# Patient Record
Sex: Female | Born: 1935 | Race: White | Hispanic: No | Marital: Single | Smoking: Former smoker
Health system: Southern US, Community
[De-identification: ages and names within clinical notes are randomized; demographics above are authoritative.]

## PROBLEM LIST (undated history)

## (undated) DIAGNOSIS — R531 Weakness: Secondary | ICD-10-CM

## (undated) DIAGNOSIS — D49519 Neoplasm of unspecified behavior of unspecified kidney: Secondary | ICD-10-CM

## (undated) DIAGNOSIS — M1990 Unspecified osteoarthritis, unspecified site: Secondary | ICD-10-CM

## (undated) DIAGNOSIS — N3281 Overactive bladder: Secondary | ICD-10-CM

## (undated) DIAGNOSIS — E785 Hyperlipidemia, unspecified: Secondary | ICD-10-CM

## (undated) DIAGNOSIS — N393 Stress incontinence (female) (male): Secondary | ICD-10-CM

## (undated) DIAGNOSIS — E039 Hypothyroidism, unspecified: Secondary | ICD-10-CM

## (undated) DIAGNOSIS — I10 Essential (primary) hypertension: Secondary | ICD-10-CM

## (undated) DIAGNOSIS — K08109 Complete loss of teeth, unspecified cause, unspecified class: Secondary | ICD-10-CM

## (undated) DIAGNOSIS — R319 Hematuria, unspecified: Secondary | ICD-10-CM

## (undated) DIAGNOSIS — R600 Localized edema: Secondary | ICD-10-CM

## (undated) DIAGNOSIS — I361 Nonrheumatic tricuspid (valve) insufficiency: Secondary | ICD-10-CM

## (undated) DIAGNOSIS — K219 Gastro-esophageal reflux disease without esophagitis: Secondary | ICD-10-CM

## (undated) DIAGNOSIS — K5909 Other constipation: Secondary | ICD-10-CM

## (undated) DIAGNOSIS — R5383 Other fatigue: Secondary | ICD-10-CM

## (undated) DIAGNOSIS — N183 Chronic kidney disease, stage 3 unspecified: Secondary | ICD-10-CM

## (undated) DIAGNOSIS — C679 Malignant neoplasm of bladder, unspecified: Secondary | ICD-10-CM

## (undated) DIAGNOSIS — Z8719 Personal history of other diseases of the digestive system: Secondary | ICD-10-CM

## (undated) DIAGNOSIS — Z972 Presence of dental prosthetic device (complete) (partial): Secondary | ICD-10-CM

## (undated) DIAGNOSIS — R5381 Other malaise: Secondary | ICD-10-CM

## (undated) HISTORY — PX: TRANSTHORACIC ECHOCARDIOGRAM: SHX275

---

## 1967-11-11 HISTORY — PX: VAGINAL HYSTERECTOMY: SUR661

## 1999-11-11 HISTORY — PX: CATARACT EXTRACTION W/ INTRAOCULAR LENS  IMPLANT, BILATERAL: SHX1307

## 2005-03-05 ENCOUNTER — Ambulatory Visit: Payer: Self-pay | Admitting: Unknown Physician Specialty

## 2006-03-02 ENCOUNTER — Ambulatory Visit: Payer: Self-pay | Admitting: Gastroenterology

## 2006-03-17 ENCOUNTER — Ambulatory Visit: Payer: Self-pay | Admitting: Unknown Physician Specialty

## 2006-03-24 ENCOUNTER — Ambulatory Visit: Payer: Self-pay | Admitting: Gastroenterology

## 2007-03-23 ENCOUNTER — Ambulatory Visit: Payer: Self-pay | Admitting: Unknown Physician Specialty

## 2008-02-08 ENCOUNTER — Ambulatory Visit: Payer: Self-pay | Admitting: Ophthalmology

## 2008-04-06 ENCOUNTER — Ambulatory Visit: Payer: Self-pay | Admitting: Unknown Physician Specialty

## 2009-05-01 ENCOUNTER — Ambulatory Visit: Payer: Self-pay | Admitting: Unknown Physician Specialty

## 2010-06-17 ENCOUNTER — Ambulatory Visit: Payer: Self-pay | Admitting: Unknown Physician Specialty

## 2010-10-30 ENCOUNTER — Ambulatory Visit: Payer: Self-pay | Admitting: Unknown Physician Specialty

## 2011-01-21 ENCOUNTER — Emergency Department: Payer: Self-pay | Admitting: Unknown Physician Specialty

## 2011-07-22 ENCOUNTER — Ambulatory Visit: Payer: Self-pay | Admitting: Unknown Physician Specialty

## 2012-07-22 ENCOUNTER — Ambulatory Visit: Payer: Self-pay | Admitting: Unknown Physician Specialty

## 2013-03-17 ENCOUNTER — Emergency Department: Payer: Self-pay | Admitting: Emergency Medicine

## 2013-03-17 LAB — URINALYSIS, COMPLETE
Bacteria: NONE SEEN
Bilirubin,UR: NEGATIVE
Glucose,UR: NEGATIVE mg/dL (ref 0–75)
Ketone: NEGATIVE
Nitrite: NEGATIVE
Ph: 5 (ref 4.5–8.0)
Protein: NEGATIVE

## 2013-03-17 LAB — CBC
HCT: 38.3 % (ref 35.0–47.0)
HGB: 13.3 g/dL (ref 12.0–16.0)
MCH: 30 pg (ref 26.0–34.0)
MCHC: 34.7 g/dL (ref 32.0–36.0)
MCV: 86 fL (ref 80–100)
RBC: 4.44 10*6/uL (ref 3.80–5.20)

## 2013-03-17 LAB — CK TOTAL AND CKMB (NOT AT ARMC)
CK, Total: 119 U/L (ref 21–215)
CK-MB: 1.7 ng/mL (ref 0.5–3.6)

## 2013-03-17 LAB — COMPREHENSIVE METABOLIC PANEL
Albumin: 4.1 g/dL (ref 3.4–5.0)
Alkaline Phosphatase: 77 U/L (ref 50–136)
Anion Gap: 8 (ref 7–16)
Bilirubin,Total: 0.5 mg/dL (ref 0.2–1.0)
Chloride: 103 mmol/L (ref 98–107)
Co2: 26 mmol/L (ref 21–32)
Creatinine: 1.45 mg/dL — ABNORMAL HIGH (ref 0.60–1.30)
EGFR (Non-African Amer.): 31 — ABNORMAL LOW
Glucose: 109 mg/dL — ABNORMAL HIGH (ref 65–99)
Osmolality: 280 (ref 275–301)
SGOT(AST): 26 U/L (ref 15–37)
SGPT (ALT): 13 U/L (ref 12–78)
Sodium: 137 mmol/L (ref 136–145)

## 2014-01-06 ENCOUNTER — Ambulatory Visit: Payer: Self-pay | Admitting: Internal Medicine

## 2015-04-03 ENCOUNTER — Emergency Department
Admission: EM | Admit: 2015-04-03 | Discharge: 2015-04-03 | Disposition: A | Discharge status: Home / Self Care | Payer: Medicare Other | Attending: Emergency Medicine | Admitting: Emergency Medicine

## 2015-04-03 ENCOUNTER — Encounter: Payer: Self-pay | Admitting: Emergency Medicine

## 2015-04-03 DIAGNOSIS — R319 Hematuria, unspecified: Secondary | ICD-10-CM | POA: Diagnosis present

## 2015-04-03 DIAGNOSIS — I10 Essential (primary) hypertension: Secondary | ICD-10-CM | POA: Insufficient documentation

## 2015-04-03 DIAGNOSIS — N3001 Acute cystitis with hematuria: Secondary | ICD-10-CM | POA: Insufficient documentation

## 2015-04-03 HISTORY — DX: Essential (primary) hypertension: I10

## 2015-04-03 LAB — COMPREHENSIVE METABOLIC PANEL
ALT: 9 U/L — ABNORMAL LOW (ref 14–54)
AST: 19 U/L (ref 15–41)
Albumin: 4.3 g/dL (ref 3.5–5.0)
Alkaline Phosphatase: 63 U/L (ref 38–126)
Anion gap: 6 (ref 5–15)
BUN: 24 mg/dL — AB (ref 6–20)
CALCIUM: 8.9 mg/dL (ref 8.9–10.3)
CHLORIDE: 107 mmol/L (ref 101–111)
CO2: 27 mmol/L (ref 22–32)
Creatinine, Ser: 1.6 mg/dL — ABNORMAL HIGH (ref 0.44–1.00)
GFR calc Af Amer: 31 mL/min — ABNORMAL LOW (ref >60)
GFR calc non Af Amer: 27 mL/min — ABNORMAL LOW (ref >60)
GLUCOSE: 109 mg/dL — AB (ref 65–99)
Potassium: 4.3 mmol/L (ref 3.5–5.1)
SODIUM: 140 mmol/L (ref 135–145)
Total Bilirubin: 0.3 mg/dL (ref 0.3–1.2)
Total Protein: 7.4 g/dL (ref 6.5–8.1)

## 2015-04-03 LAB — URINALYSIS COMPLETE WITH MICROSCOPIC (ARMC ONLY)
BILIRUBIN URINE: NEGATIVE
Glucose, UA: NEGATIVE mg/dL
Ketones, ur: NEGATIVE mg/dL
Nitrite: NEGATIVE
PH: 5 (ref 5.0–8.0)
PROTEIN: 100 mg/dL — AB
Specific Gravity, Urine: 1.02 (ref 1.005–1.030)
Trans Epithel, UA: 1

## 2015-04-03 LAB — CBC
HCT: 38.7 % (ref 35.0–47.0)
Hemoglobin: 12.7 g/dL (ref 12.0–16.0)
MCH: 27.8 pg (ref 26.0–34.0)
MCHC: 32.7 g/dL (ref 32.0–36.0)
MCV: 84.9 fL (ref 80.0–100.0)
Platelets: 177 10*3/uL (ref 150–440)
RBC: 4.56 MIL/uL (ref 3.80–5.20)
RDW: 15.7 % — AB (ref 11.5–14.5)
WBC: 4.4 10*3/uL (ref 3.6–11.0)

## 2015-04-03 MED ORDER — CIPROFLOXACIN HCL 250 MG PO TABS: 250.0000 mg | ORAL_TABLET | Freq: Two times a day (BID) | ORAL | Status: AC

## 2015-04-03 MED ORDER — CIPROFLOXACIN HCL 500 MG PO TABS
ORAL_TABLET | ORAL | Status: AC
  Administered 2015-04-03: 500 mg via ORAL
  Filled 2015-04-03: qty 1

## 2015-04-03 MED ORDER — CIPROFLOXACIN HCL 500 MG PO TABS
500.0000 mg | ORAL_TABLET | Freq: Once | ORAL | Status: AC
  Administered 2015-04-03: 500 mg via ORAL

## 2015-04-03 NOTE — Discharge Instructions (Signed)

## 2015-04-03 NOTE — ED Provider Notes (Signed)
[facility] Provider Note  ____________________________________________  Time seen: 6:35 PM  I have reviewed the triage vital signs and the nursing notes. Elements of past medical history surgical history allergies medications and social history and family history obtained from [facility] everywhere through primary care doctor's clinic visit note in April 2016  HISTORY  Chief Complaint Hematuria    HPI Jody Mcclain is a 81 y.o. female who complains of hematuria since yesterday. She denies any dysuria frequency or urgency although she chronically has together the bathroom frequently due to her HCTZ use for hypertension. No fever chills nausea vomiting lightheadedness or syncope. No chest pain or shortness of breath. She is tolerating oral medications and eating and drinking normally. This apart from the hematuria she feels totally fine. She does note that she has had several urinary tract infections in the past, which may be related to bladder prolapse     Past Medical History  Diagnosis Date  . Hypertension   . Thyroid disease   . Hiatal hernia unk    There are no active problems to display for this patient.   History reviewed. No pertinent past surgical history.  Current Outpatient Rx  Name  Route  Sig  Dispense  Refill  . ciprofloxacin (CIPRO) 250 MG tablet   Oral   Take 1 tablet (250 mg total) by mouth 2 (two) times daily.   20 tablet   0   amLODIPine (NORVASC) 10 MG tablet, TAKE 1 TABLET BY MOUTH ONCE A DAY aspirin 81 MG EC tablet, Take 81 mg by mouth once daily. calcium carbonate-vit D3-min (CALCIUM 600 + MINERALS) 600 mg calcium- 200 unit Tab, Take 1 tablet by mouth once daily. cholecalciferol (VITAMIN D3) 1,000 unit capsule, Take 1,000 Units by mouth once daily. hydrochlorothiazide (HYDRODIURIL) 25 MG tablet, Take 1 tablet (25 mg total) by mouth once daily. levothyroxine (SYNTHROID, LEVOTHROID) 50 MCG tablet, TAKE 1  TABLET BY MOUTH ONCE A DAY lovastatin (MEVACOR) 40 MG tablet, TAKE 1 TABLET BY MOUTH AT BEDTIME metoprolol tartrate (LOPRESSOR) 25 MG tablet, 25 mg. Take 1/2 tablets by mouth once a day. omeprazole (PRILOSEC) 40 MG DR capsule, Take 1 capsule (40 mg total) by mouth once daily. ZETIA 10 mg tablet, TAKE 1 TABLET BY MOUTH ONCE A DAY AS DIRECTED   Allergies Review of patient's allergies indicates not on file.  No family history on file.  Social History History  Substance Use Topics  . Smoking status: Never Smoker   . Smokeless tobacco: Not on file  . Alcohol Use: No    Allergies as of 02/21/2015 Elta [place] as Reviewed 02/21/2015  Allergen Reaction Noted  . Augmentin [amoxicillin-pot clavulanate] Nausea 05/23/2014  . Boniva [ibandronate] Unknown 05/23/2014  . Ceftin [cefuroxime axetil] Nausea 05/23/2014  . Fosamax [alendronate] Unknown 05/23/2014  . Lipitor [atorvastatin] Unknown 05/23/2014  . Sulfa (sulfonamide antibiotics) Nausea 05/23/2014  . Thiazides Unknown 05/23/2014   Past Medical History  Diagnosis Date  . Hypothyroid  . Hiatal hernia  . Hypercholesterolemia  . Hypertension  . COPD (chronic obstructive pulmonary disease)  on chest x-ray  . Osteoporosis  . DJD (degenerative joint disease)  . DDD (degenerative disc disease)  . PVC's (premature ventricular contractions)  hx of; used low dose metoprolol but then had bradycardia and fatigue so discontinued  . Severe tricuspid regurgitation  . Sinus arrhythmia  . GERD (gastroesophageal reflux disease)   Past Surgical History  Procedure Laterality Date  . Hysterectomy  . Cataract extraction  Left  . Cataract extraction Right  . Two vertebral fractures secondary to a fall 2003   History   Social History  . Marital Status: Married  Spouse Name: N/A  . Number of Children: 5  . Years of Education: 7   Social History Main Topics  . Smoking status: Never Smoker  . Smokeless tobacco: Never Used  . Alcohol Use: No  .  Drug Use: No  . Sexual Activity: Defer   Other Topics Concern  . None   Social History Narrative   Family History  Problem Relation Age of Onset  . Hypertension Other  . Heart disease Other  . Stroke Other  . Thyroid disease Other  . Psoriasis Other  . Heart attack Mother  . No Known Problems Father    Review of Systems  Constitutional: No fever or chills. No weight changes Eyes:No blurry vision or double vision.  ENT: No sore throat. Cardiovascular: No chest pain. Respiratory: No dyspnea or cough. Gastrointestinal: Negative for abdominal pain, vomiting and diarrhea.  No BRBPR or melena. Genitourinary: Negative for dysuria, urinary retention, bloody urine, or difficulty urinating. Musculoskeletal: Negative for back pain. No joint swelling or pain. Skin: Negative for rash. Neurological: Negative for headaches, focal weakness or numbness. Psychiatric:No anxiety or depression.   Endocrine:No hot/cold intolerance, changes in energy, or sleep difficulty.  10-point ROS otherwise negative.  ____________________________________________   PHYSICAL EXAM:  VITAL SIGNS: ED Triage Vitals  Enc Vitals Group     BP 04/03/15 1817 180/69 mmHg     Pulse Rate 04/03/15 1817 71     Resp 04/03/15 1817 20     Temp 04/03/15 1817 98.2 F (36.8 C)     Temp Source 04/03/15 1817 Oral     SpO2 04/03/15 1817 99 %     Weight 04/03/15 1817 120 lb (54.432 kg)     Height 04/03/15 1817 5' (1.524 m)     Head Cir --      Peak Flow --      Pain Score 04/03/15 1818 2     Pain Loc --      Pain Edu? --      Excl. in [place]? --      Constitutional: Alert and oriented. Well appearing and in no distress. Eyes: No scleral icterus. No conjunctival pallor. PERRL. EOMI ENT   Head: Normocephalic and atraumatic.   Nose: No congestion/rhinnorhea. No septal hematoma   Mouth/Throat: MMM, no pharyngeal erythema. No peritonsillar mass. No uvula shift.   Neck: No stridor. No SubQ emphysema. No  meningismus. Hematological/Lymphatic/Immunilogical: No cervical lymphadenopathy. Cardiovascular: RRR. Normal and symmetric distal pulses are present in all extremities. No murmurs, rubs, or gallops. Respiratory: Normal respiratory effort without tachypnea nor retractions. Breath sounds are clear and equal bilaterally. No wheezes/rales/rhonchi. Gastrointestinal: Soft and nontender. No distention. There is no CVA tenderness.  No rebound, rigidity, or guarding. Genitourinary: deferred Musculoskeletal: Nontender with normal range of motion in all extremities. No joint effusions.  No lower extremity tenderness.  No edema. Neurologic:   Normal speech and language.  CN 2-10 normal. Motor grossly intact. No pronator drift.  Normal gait. No gross focal neurologic deficits are appreciated.  Skin:  Skin is warm, dry and intact. No rash noted.  No petechiae, purpura, or bullae. Psychiatric: Mood and affect are normal. Speech and behavior are normal. Patient exhibits appropriate insight and judgment.  ____________________________________________    LABS (pertinent positives/negatives) (all labs ordered are listed, but only abnormal results are displayed) Labs Reviewed  CBC - Abnormal; Notable for the following:    RDW 15.7 (*)    All other components within normal limits  COMPREHENSIVE METABOLIC PANEL - Abnormal; Notable for the following:    Glucose, Bld 109 (*)    BUN 24 (*)    Creatinine, Ser 1.60 (*)    ALT 9 (*)    GFR calc non Af Amer 27 (*)    GFR calc Af Amer 31 (*)    All other components within normal limits  URINALYSIS COMPLETEWITH MICROSCOPIC ([facility])  - Abnormal; Notable for the following:    Color, Urine YELLOW (*)    APPearance CLOUDY (*)    Hgb urine dipstick 3+ (*)    Protein, ur 100 (*)    Leukocytes, UA 1+ (*)    Bacteria, UA RARE (*)    Squamous Epithelial / LPF 6-30 (*)    All other components within normal limits  URINE CULTURE   ___Urine red blood cell too  numerous to count, urine white blood cell too numerous to count _________________________________________   EKG    ____________________________________________    RADIOLOGY    ____________________________________________   PROCEDURES  ____________________________________________   INITIAL IMPRESSION / ASSESSMENT AND PLAN / ED COURSE  Pertinent labs & imaging results that were available during my care of the patient were reviewed by me and considered in my medical decision making (see chart for details).  Patient presents with hematuria and bacteriuria consistent with urinary tract infection. There is no evidence of pyelonephritis or sepsis. No evidence of kidney stone, kidney abscess, or obstructed ureteral abscess. Patient is well-appearing, no acute distress, at baseline other than the UTI. Due to her allergies I will start her on Cipro 500 by mouth now and 250 twice a day until she follows up with her primary care doctor. I did counsel her on repeated UA with her PCP to ensure resolution and possibility of renal cancer if she has persistent hematuria.  ____________________________________________   FINAL CLINICAL IMPRESSION(S) / ED DIAGNOSES  Final diagnoses:  Acute cystitis with hematuria      Carrie Mew, MD 04/03/15 [phone]

## 2015-04-03 NOTE — ED Notes (Signed)
Noticed blood in urine yesterday and again today

## 2015-04-06 LAB — URINE CULTURE: CULTURE: NO GROWTH

## 2015-04-20 ENCOUNTER — Other Ambulatory Visit: Payer: Self-pay | Admitting: Internal Medicine

## 2015-04-20 DIAGNOSIS — N184 Chronic kidney disease, stage 4 (severe): Secondary | ICD-10-CM

## 2015-04-26 ENCOUNTER — Ambulatory Visit
Admission: RE | Admit: 2015-04-26 | Discharge: 2015-04-26 | Disposition: A | Discharge status: Home / Self Care | Payer: Medicare Other | Source: Ambulatory Visit | Attending: Internal Medicine | Admitting: Internal Medicine

## 2015-04-26 DIAGNOSIS — N281 Cyst of kidney, acquired: Secondary | ICD-10-CM | POA: Insufficient documentation

## 2015-04-26 DIAGNOSIS — N184 Chronic kidney disease, stage 4 (severe): Secondary | ICD-10-CM | POA: Diagnosis not present

## 2015-05-01 ENCOUNTER — Ambulatory Visit: Payer: Medicare Other

## 2015-05-08 ENCOUNTER — Other Ambulatory Visit: Payer: Self-pay | Admitting: Urology

## 2015-05-15 ENCOUNTER — Encounter (HOSPITAL_BASED_OUTPATIENT_CLINIC_OR_DEPARTMENT_OTHER): Payer: Self-pay | Admitting: *Deleted

## 2015-05-16 ENCOUNTER — Encounter (HOSPITAL_BASED_OUTPATIENT_CLINIC_OR_DEPARTMENT_OTHER): Payer: Self-pay | Admitting: *Deleted

## 2015-05-16 NOTE — Progress Notes (Signed)
NPO AFTER MN.  ARRIVE AT [id number].  NEEDS ISTAT 8.  CURRENT EKG,  LOV NOTE, ECHO TO BE FAXED FROM DR PARASCHOS Jefm Bryant).  WILL TAKE AM MEDS W/ SIPS OF WATER DOS.

## 2015-05-17 ENCOUNTER — Other Ambulatory Visit (HOSPITAL_COMMUNITY): Payer: Self-pay | Admitting: Urology

## 2015-05-17 DIAGNOSIS — D49519 Neoplasm of unspecified behavior of unspecified kidney: Secondary | ICD-10-CM

## 2015-05-17 NOTE — Progress Notes (Signed)
Received echo and ekg from Dr. Alean Rinne office.  Needs ekg on arrival.

## 2015-05-17 NOTE — Progress Notes (Signed)
Chart reviewed by Dr. Delma Post, ok to proceed.

## 2015-05-18 ENCOUNTER — Encounter (HOSPITAL_BASED_OUTPATIENT_CLINIC_OR_DEPARTMENT_OTHER)
Admission: RE | Disposition: A | Discharge status: Home / Self Care | Payer: Self-pay | Source: Ambulatory Visit | Attending: Urology

## 2015-05-18 ENCOUNTER — Other Ambulatory Visit: Payer: Self-pay

## 2015-05-18 ENCOUNTER — Encounter (HOSPITAL_BASED_OUTPATIENT_CLINIC_OR_DEPARTMENT_OTHER): Payer: Self-pay | Admitting: Anesthesiology

## 2015-05-18 ENCOUNTER — Ambulatory Visit (HOSPITAL_BASED_OUTPATIENT_CLINIC_OR_DEPARTMENT_OTHER): Payer: Medicare Other | Admitting: Anesthesiology

## 2015-05-18 ENCOUNTER — Ambulatory Visit (HOSPITAL_BASED_OUTPATIENT_CLINIC_OR_DEPARTMENT_OTHER)
Admission: RE | Admit: 2015-05-18 | Discharge: 2015-05-18 | Disposition: A | Discharge status: Home / Self Care | Payer: Medicare Other | Source: Ambulatory Visit | Attending: Urology | Admitting: Urology

## 2015-05-18 DIAGNOSIS — E780 Pure hypercholesterolemia: Secondary | ICD-10-CM | POA: Insufficient documentation

## 2015-05-18 DIAGNOSIS — C679 Malignant neoplasm of bladder, unspecified: Secondary | ICD-10-CM | POA: Diagnosis not present

## 2015-05-18 DIAGNOSIS — Z90710 Acquired absence of both cervix and uterus: Secondary | ICD-10-CM | POA: Insufficient documentation

## 2015-05-18 DIAGNOSIS — Z882 Allergy status to sulfonamides status: Secondary | ICD-10-CM | POA: Insufficient documentation

## 2015-05-18 DIAGNOSIS — N329 Bladder disorder, unspecified: Secondary | ICD-10-CM | POA: Diagnosis present

## 2015-05-18 DIAGNOSIS — N183 Chronic kidney disease, stage 3 (moderate): Secondary | ICD-10-CM | POA: Insufficient documentation

## 2015-05-18 DIAGNOSIS — Z87891 Personal history of nicotine dependence: Secondary | ICD-10-CM | POA: Diagnosis not present

## 2015-05-18 DIAGNOSIS — Z888 Allergy status to other drugs, medicaments and biological substances status: Secondary | ICD-10-CM | POA: Insufficient documentation

## 2015-05-18 DIAGNOSIS — Z79899 Other long term (current) drug therapy: Secondary | ICD-10-CM | POA: Diagnosis not present

## 2015-05-18 DIAGNOSIS — I129 Hypertensive chronic kidney disease with stage 1 through stage 4 chronic kidney disease, or unspecified chronic kidney disease: Secondary | ICD-10-CM | POA: Diagnosis not present

## 2015-05-18 DIAGNOSIS — K449 Diaphragmatic hernia without obstruction or gangrene: Secondary | ICD-10-CM | POA: Diagnosis not present

## 2015-05-18 DIAGNOSIS — K219 Gastro-esophageal reflux disease without esophagitis: Secondary | ICD-10-CM | POA: Diagnosis not present

## 2015-05-18 DIAGNOSIS — D494 Neoplasm of unspecified behavior of bladder: Secondary | ICD-10-CM

## 2015-05-18 HISTORY — DX: Chronic kidney disease, stage 3 unspecified: N18.30

## 2015-05-18 HISTORY — DX: Nonrheumatic tricuspid (valve) insufficiency: I36.1

## 2015-05-18 HISTORY — DX: Presence of dental prosthetic device (complete) (partial): Z97.2

## 2015-05-18 HISTORY — DX: Other fatigue: R53.83

## 2015-05-18 HISTORY — DX: Stress incontinence (female) (male): N39.3

## 2015-05-18 HISTORY — PX: TRANSURETHRAL RESECTION OF BLADDER TUMOR WITH GYRUS (TURBT-GYRUS): SHX6458

## 2015-05-18 HISTORY — DX: Presence of dental prosthetic device (complete) (partial): K08.109

## 2015-05-18 HISTORY — DX: Unspecified osteoarthritis, unspecified site: M19.90

## 2015-05-18 HISTORY — DX: Hyperlipidemia, unspecified: E78.5

## 2015-05-18 HISTORY — DX: Hematuria, unspecified: R31.9

## 2015-05-18 HISTORY — DX: Overactive bladder: N32.81

## 2015-05-18 HISTORY — DX: Gastro-esophageal reflux disease without esophagitis: K21.9

## 2015-05-18 HISTORY — DX: Personal history of other diseases of the digestive system: Z87.19

## 2015-05-18 HISTORY — DX: Chronic kidney disease, stage 3 (moderate): N18.3

## 2015-05-18 HISTORY — DX: Localized edema: R60.0

## 2015-05-18 HISTORY — DX: Other constipation: K59.09

## 2015-05-18 HISTORY — DX: Hypothyroidism, unspecified: E03.9

## 2015-05-18 HISTORY — DX: Other malaise: R53.81

## 2015-05-18 HISTORY — DX: Weakness: R53.1

## 2015-05-18 LAB — POCT I-STAT, CHEM 8
BUN: 20 mg/dL (ref 6–20)
Calcium, Ion: 1.24 mmol/L (ref 1.13–1.30)
Chloride: 107 mmol/L (ref 101–111)
Creatinine, Ser: 1.4 mg/dL — ABNORMAL HIGH (ref 0.44–1.00)
Glucose, Bld: 93 mg/dL (ref 65–99)
HCT: 39 % (ref 36.0–46.0)
Hemoglobin: 13.3 g/dL (ref 12.0–15.0)
Potassium: 4.4 mmol/L (ref 3.5–5.1)
Sodium: 142 mmol/L (ref 135–145)
TCO2: 24 mmol/L (ref 0–100)

## 2015-05-18 SURGERY — TRANSURETHRAL RESECTION OF BLADDER TUMOR WITH GYRUS (TURBT-GYRUS)
Anesthesia: General

## 2015-05-18 MED ORDER — HYDROCODONE-ACETAMINOPHEN 5-325 MG PO TABS: 1.0000 | ORAL_TABLET | Freq: Four times a day (QID) | ORAL | Status: DC | PRN

## 2015-05-18 MED ORDER — DEXAMETHASONE SODIUM PHOSPHATE 4 MG/ML IJ SOLN
INTRAMUSCULAR | Status: DC | PRN
  Administered 2015-05-18: 10 mg via INTRAVENOUS

## 2015-05-18 MED ORDER — PROPOFOL 10 MG/ML IV BOLUS
INTRAVENOUS | Status: DC | PRN
  Administered 2015-05-18: 30 mg via INTRAVENOUS
  Administered 2015-05-18: 100 mg via INTRAVENOUS

## 2015-05-18 MED ORDER — PHENAZOPYRIDINE HCL 200 MG PO TABS: 200.0000 mg | ORAL_TABLET | Freq: Three times a day (TID) | ORAL | Status: DC | PRN

## 2015-05-18 MED ORDER — PHENAZOPYRIDINE HCL 200 MG PO TABS
200.0000 mg | ORAL_TABLET | Freq: Three times a day (TID) | ORAL | Status: AC
  Administered 2015-05-18: 200 mg via ORAL
  Filled 2015-05-18: qty 1

## 2015-05-18 MED ORDER — CIPROFLOXACIN IN D5W 200 MG/100ML IV SOLN
200.0000 mg | INTRAVENOUS | Status: AC
  Administered 2015-05-18: 200 mg via INTRAVENOUS
  Filled 2015-05-18: qty 100

## 2015-05-18 MED ORDER — PHENAZOPYRIDINE HCL 100 MG PO TABS
ORAL_TABLET | ORAL | Status: AC
  Filled 2015-05-18: qty 2

## 2015-05-18 MED ORDER — ACETAMINOPHEN 10 MG/ML IV SOLN
INTRAVENOUS | Status: DC | PRN
  Administered 2015-05-18: 1000 mg via INTRAVENOUS

## 2015-05-18 MED ORDER — SODIUM CHLORIDE 0.9 % IR SOLN
Status: DC | PRN
  Administered 2015-05-18: 12000 mL via INTRAVESICAL

## 2015-05-18 MED ORDER — CIPROFLOXACIN IN D5W 200 MG/100ML IV SOLN
INTRAVENOUS | Status: AC
  Filled 2015-05-18: qty 100

## 2015-05-18 MED ORDER — PHENYLEPHRINE HCL 10 MG/ML IJ SOLN
INTRAMUSCULAR | Status: DC | PRN
  Administered 2015-05-18: 120 ug via INTRAVENOUS
  Administered 2015-05-18: 80 ug via INTRAVENOUS
  Administered 2015-05-18: 40 ug via INTRAVENOUS

## 2015-05-18 MED ORDER — SODIUM CHLORIDE 0.9 % IV SOLN
INTRAVENOUS | Status: DC
Start: 2015-05-18 — End: 2015-05-18
  Administered 2015-05-18: 09:00:00 via INTRAVENOUS
  Filled 2015-05-18: qty 1000

## 2015-05-18 MED ORDER — ONDANSETRON HCL 4 MG/2ML IJ SOLN
INTRAMUSCULAR | Status: DC | PRN
  Administered 2015-05-18: 4 mg via INTRAVENOUS

## 2015-05-18 MED ORDER — FENTANYL CITRATE (PF) 100 MCG/2ML IJ SOLN
INTRAMUSCULAR | Status: AC
  Filled 2015-05-18: qty 2

## 2015-05-18 MED ORDER — FENTANYL CITRATE (PF) 100 MCG/2ML IJ SOLN
INTRAMUSCULAR | Status: DC | PRN
  Administered 2015-05-18: 50 ug via INTRAVENOUS

## 2015-05-18 MED ORDER — LIDOCAINE HCL (CARDIAC) 20 MG/ML IV SOLN
INTRAVENOUS | Status: DC | PRN
  Administered 2015-05-18: 40 mg via INTRAVENOUS

## 2015-05-18 SURGICAL SUPPLY — 36 items
BAG DRAIN URO-CYSTO SKYTR STRL (DRAIN) ×2 IMPLANT
BAG DRN ANRFLXCHMBR STRAP LEK (BAG)
BAG DRN UROCATH (DRAIN) ×1
BAG URINE DRAINAGE (UROLOGICAL SUPPLIES) IMPLANT
BAG URINE LEG 19OZ MD ST LTX (BAG) IMPLANT
CANISTER SUCT LVC 12 LTR MEDI- (MISCELLANEOUS) ×2 IMPLANT
CATH FOLEY 3WAY 30CC 24FR (CATHETERS)
CATH HEMA 3WAY 30CC 24FR COUDE (CATHETERS) IMPLANT
CATH HEMA 3WAY 30CC 24FR RND (CATHETERS) IMPLANT
CATH URTH STD 24FR FL 3W 2 (CATHETERS) ×1 IMPLANT
CLOTH BEACON ORANGE TIMEOUT ST (SAFETY) ×2 IMPLANT
ELECT BIVAP BIPO 22/24 DONUT (ELECTROSURGICAL) ×2
ELECT BUTTON BIOP 24F 90D PLAS (MISCELLANEOUS) IMPLANT
ELECT REM PT RETURN 9FT ADLT (ELECTROSURGICAL) ×2
ELECTRD BIVAP BIPO 22/24 DONUT (ELECTROSURGICAL) ×1 IMPLANT
ELECTRODE REM PT RTRN 9FT ADLT (ELECTROSURGICAL) ×1 IMPLANT
EVACUATOR MICROVAS BLADDER (UROLOGICAL SUPPLIES) ×1 IMPLANT
GLOVE BIO SURGEON STRL SZ8 (GLOVE) ×2 IMPLANT
GLOVE BIOGEL M 7.0 STRL (GLOVE) ×1 IMPLANT
GLOVE INDICATOR 7.0 STRL GRN (GLOVE) ×1 IMPLANT
GLOVE SURG SS PI 7.0 STRL IVOR (GLOVE) ×2 IMPLANT
GOWN STRL REUS W/ TWL LRG LVL3 (GOWN DISPOSABLE) ×1 IMPLANT
GOWN STRL REUS W/ TWL XL LVL3 (GOWN DISPOSABLE) ×1 IMPLANT
GOWN STRL REUS W/TWL LRG LVL3 (GOWN DISPOSABLE) ×4
GOWN STRL REUS W/TWL XL LVL3 (GOWN DISPOSABLE) ×2
HOLDER FOLEY CATH W/STRAP (MISCELLANEOUS) IMPLANT
IV NS IRRIG 3000ML ARTHROMATIC (IV SOLUTION) ×4 IMPLANT
LOOP CUT BIPOLAR 24F LRG (ELECTROSURGICAL) ×2 IMPLANT
MANIFOLD NEPTUNE II (INSTRUMENTS) ×1 IMPLANT
NS IRRIG 500ML POUR BTL (IV SOLUTION) ×2 IMPLANT
PACK CYSTO (CUSTOM PROCEDURE TRAY) ×2 IMPLANT
PLUG CATH AND CAP STER (CATHETERS) IMPLANT
SET ASPIRATION TUBING (TUBING) ×2 IMPLANT
SYR 30ML LL (SYRINGE) IMPLANT
SYRINGE IRR TOOMEY STRL 70CC (SYRINGE) IMPLANT
WATER STERILE IRR 3000ML UROMA (IV SOLUTION) IMPLANT

## 2015-05-18 NOTE — Anesthesia Procedure Notes (Addendum)
Procedure Name: Intubation Date/Time: 05/18/2015 9:29 AM Performed by: Wanita Chamberlain Pre-anesthesia Checklist: Timeout performed, Patient identified, Emergency Drugs available, Suction available and Patient being monitored Patient Re-evaluated:Patient Re-evaluated prior to inductionOxygen Delivery Method: Circle system utilized Preoxygenation: Pre-oxygenation with 100% oxygen Intubation Type: IV induction, Cricoid Pressure applied and Rapid sequence Grade View: Grade I Tube type: Oral Tube size: 7.0 mm Number of attempts: 1 Airway Equipment and Method: Stylet Placement Confirmation: positive ETCO2,  ETT inserted through vocal cords under direct vision and breath sounds checked- equal and bilateral Secured at: 19 cm Tube secured with: Tape Dental Injury: Teeth and Oropharynx as per pre-operative assessment  Comments: Elective intubation. Pt with significant reflux Hx pt didn't take her protonix this am

## 2015-05-18 NOTE — H&P (Signed)
Reason For Visit Jody Mcclain is a 81 year old female patient of Dr. Candiss Norse seen in office consultation today for further evaluation of gross hematuria and a possible bladder lesion.   History of Present Illness The patient has had an elevation of her creatinine to 1.5. This prompted a renal ultrasound on 04/26/15 which revealed changes of the kidneys consistent with medical renal disease, bilateral renal cysts as well as a 1.6 cm echogenic focus within the bladder that did not appear to be mobile like a clot. She had been seen in the emergency room and, as is typical, was treated with antibiotics for UTI which she did not have and this was proven by a negative culture. She has been a cigarette smoker (60-pack-year history). She reports to me that for 2-3 weeks she has experienced gross hematuria. It has been associated with clots. She said for the past year now she has had nocturia 5-6 times. This is associated with frequency and urgency in the daytime. She also indicates that she had some low back pain but has been present for some time. She said she was told in the past that she might have a kidney stone but has never passed a stone or had any symptoms to suggest passage of the stone. She does however report that she has discomfort in her bladder when it fills up and gets relief when she empties her bladder.  Her gross hematuria and bladder mass would be considered moderately severe with no modifying factors for other associated signs and symptoms than as above.   Past Medical History Problems  1. History of High cholesterol (E78.0) 2. History of arthritis (Z87.39) 3. History of esophageal reflux (Z87.19) 4. History of hypertension (Z86.79)  Surgical History Problems  1. History of Hysterectomy  Current Meds 1. AmLODIPine Besylate 10 MG Oral Tablet;  Therapy: (Recorded:28Jun2016) to Recorded 2. Aspirin Low Dose 81 MG TABS;  Therapy: (Recorded:28Jun2016) to Recorded 3. Hydrochlorothiazide 25  MG Oral Tablet;  Therapy: (Recorded:28Jun2016) to Recorded 4. Levothyroxine Sodium 50 MCG Oral Tablet;  Therapy: (Recorded:28Jun2016) to Recorded 5. Lovastatin 40 MG Oral Tablet;  Therapy: (Recorded:28Jun2016) to Recorded 6. Metoprolol Tartrate 25 MG Oral Tablet;  Therapy: (Recorded:28Jun2016) to Recorded 7. Omeprazole 40 MG Oral Capsule Delayed Release;  Therapy: (Recorded:28Jun2016) to Recorded 8. Zetia 10 MG Oral Tablet;  Therapy: (Recorded:28Jun2016) to Recorded  Allergies Medication  1. No Known Drug Allergies  Family History Problems  1. Family history of No significant past medical history : Mother  Social History Problems    Former smoker [phone])   [id number] years ago   Number of children   2 sons and 3 daughters   Retired   Widower  Review of Systems Genitourinary, constitutional, skin, eye, otolaryngeal, hematologic/lymphatic, cardiovascular, pulmonary, endocrine, musculoskeletal, gastrointestinal, neurological and psychiatric system(s) were reviewed and pertinent findings if present are noted and are otherwise negative.  Genitourinary: urinary frequency, nocturia and hematuria.  Gastrointestinal: heartburn and constipation.  Constitutional: feeling tired (fatigue).  Eyes: blurred vision.  ENT: sinus problems.  Cardiovascular: leg swelling.  Respiratory: shortness of breath.  Musculoskeletal: back pain.    Vitals Vital Signs   Height: 5 ft 1 in Weight: 124 lb  BMI Calculated: 23.43 BSA Calculated: 1.54 Blood Pressure: 158 / 66 Heart Rate: 75  Physical Exam Constitutional: Well nourished and well developed . No acute distress.  ENT:. The ears and nose are normal in appearance.  Neck: The appearance of the neck is normal and no neck mass is present.  Pulmonary:  No respiratory distress and normal respiratory rhythm and effort.  Cardiovascular: Heart rate and rhythm are normal . No peripheral edema.  Abdomen: The abdomen is soft and nontender. No  masses are palpated. No CVA tenderness. No hernias are palpable. No hepatosplenomegaly noted.  Genitourinary:  Chaperone Present: .  Examination of the external genitalia shows vulvar atrophy, but normal female external genitalia and no lesions. The urethra is normal in appearance and not tender. There is no urethral mass. Vaginal exam demonstrates atrophy. The cervix is is absent. The uterus is absent. The bladder is non tender and not distended. The anus is normal on inspection. The perineum is normal on inspection.  Lymphatics: The femoral and inguinal nodes are not enlarged or tender.  Skin: Normal skin turgor, no visible rash and no visible skin lesions.  Neuro/Psych:. Mood and affect are appropriate.    Results/Data Urine  COLOR YELLOW  APPEARANCE CLOUDY  SPECIFIC GRAVITY 1.020  pH 6.0  GLUCOSE NEG mg/dL BILIRUBIN NEG  KETONE NEG mg/dL BLOOD LARGE  PROTEIN 100 mg/dL UROBILINOGEN 0.2 mg/dL NITRITE NEG  LEUKOCYTE ESTERASE MOD  SQUAMOUS EPITHELIAL/HPF RARE  WBC 7-10 WBC/hpf RBC 21-50 RBC/hpf BACTERIA RARE  CRYSTALS NONE SEEN  CASTS NONE SEEN   Old records or history reviewed: Unfortunately I was 37 pages of notes which consisted of duplicates of most of all of the information as well as patient information for review.  The following images/tracing/specimen were independently visualized:  Renal ultrasound as above.  The following clinical lab reports were reviewed:  UA: She had significant red cells noted microscopically. There were some white cells noted as well.  The following radiology reports were reviewed: Renal ultrasound.    Procedure  Procedure: Cystoscopy  Chaperone Present: Sonia.  Indication: Hematuria. Bladder Mass.  Informed Consent: Risks, benefits, and potential adverse events were discussed and informed consent was obtained from the patient.  Prep: The patient was prepped with hibiclens.  Procedure Note:  Urethral meatus:. No abnormalities.  Anterior  urethra: No abnormalities.  Bladder: Visulization was clear. The ureteral orifices were in the normal anatomic position bilaterally and had clear efflux of urine. A sessile tumor was seen in the bladder. This tumor was located on the left side, on the posterior aspect of the bladder. The patient tolerated the procedure well.  Complications: None.    Assessment   I went over the results of the renal ultrasound as well as my cystoscopic findings today which have revealed a bladder mass consistent with transitional cell carcinoma. There appeared to be new sessile lesions in the bladder on on the posterior, superior wall just to the left of midline and a second on the left wall. She was uncomfortable during the procedure as her bladder filled. Further characterization of the lesion is required for grading and staging purposes. We discussed proceeding with evaluation using transurethral resection of the lesion. I have discussed the procedure in detail as well as the potential risks and complications associated with this form of surgery. We also discussed the probability of successful resection of the intravesical portion of this lesion. I have recommended, as long as there is no contraindication at the time of surgery, the placement of intravesical mitomycin-C in order to reduce the risk of recurrence. We did discuss the potential side effects of this form of intravesical chemotherapy. The procedure will be performed under anesthesia as an outpatient.    She is having a lot of irritative symptoms. I think that this could be due to the  bladder tumors but there may be some underlying bladder overactivity. Either way I'm going to try using Myrbetriq 25 mg and give her samples and see if this helps her get a bit more sleep at night.    I told her that my concern is that of extravesical extension of what I have discovered today and because a renal ultrasound does not evaluate the urothelium I have recommended  further evaluation of the upper tract and pelvis with a CT scan.   Plan   1. Stop aspirin.  2. Culture urine in preparation for surgery.  3. She will be scheduled for transurethral resection of bladder lesions

## 2015-05-18 NOTE — Discharge Instructions (Signed)
Transurethral Resection of Bladder Tumor (TURBT)   Definition:  Transurethral Resection of the Bladder Tumor is a surgical procedure used to diagnose and remove tumors within the bladder. TURBT is the most common treatment for early stage bladder cancer.  General instructions:     Your recent bladder surgery requires very little post hospital care but some definite precautions.  Despite the fact that no skin incisions were used, the area around the bladder incisions are raw and covered with scabs to promote healing and prevent bleeding. Certain precautions are needed to insure that the scabs are not disturbed over the next 2-4 weeks while the healing proceeds.  Because the raw surface inside your bladder and the irritating effects of urine you may expect frequency of urination and/or urgency (a stronger desire to urinate) and perhaps even getting up at night more often. This will usually resolve or improve slowly over the healing period. You may see some blood in your urine over the first 6 weeks. Do not be alarmed, even if the urine was clear for a while. Get off your feet and drink lots of fluids until clearing occurs. If you start to pass clots or don't improve call us.  Catheter: (If you are discharged with a catheter.)  1. Keep your catheter secured to your leg at all times with tape or the supplied strap. 2. You may experience leakage of urine around your catheter- as long as the  catheter continues to drain, this is normal.  If your catheter stops draining  go to the ER. 3. You may also have blood in your urine, even after it has been clear for  several days; you may even pass some small blood clots or other material.  This  is normal as well.  If this happens, sit down and drink plenty of water to help  make urine to flush out your bladder.  If the blood in your urine becomes worse  after doing this, contact our office or return to the ER. 4. You may use the leg bag (small bag)  during the day, but use the large bag at  night.  Diet:  You may return to your normal diet immediately. Because of the raw surface of your bladder, alcohol, spicy foods, foods high in acid and drinks with caffeine may cause irritation or frequency and should be used in moderation. To keep your urine flowing freely and avoid constipation, drink plenty of fluids during the day (8-10 glasses). Tip: Avoid cranberry juice because it is very acidic.  Activity:  Your physical activity doesn't need to be restricted. However, if you are very active, you may see some blood in the urine. We suggest that you reduce your activity under the circumstances until the bleeding has stopped.  Bowels:  It is important to keep your bowels regular during the postoperative period. Straining with bowel movements can cause bleeding. A bowel movement every other day is reasonable. Use a mild laxative if needed, such as milk of magnesia 2-3 tablespoons, or 2 Dulcolax tablets. Call if you continue to have problems. If you had been taking narcotics for pain, before, during or after your surgery, you may be constipated. Take a laxative if necessary.    Medication:  You should resume your pre-surgery medications unless told not to. In addition you may be given an antibiotic to prevent or treat infection. Antibiotics are not always necessary. All medication should be taken as prescribed until the bottles are finished unless you are having  an unusual reaction to one of the drugs.    Post Anesthesia Home Care Instructions  Activity: Get plenty of rest for the remainder of the day. A responsible adult should stay with you for 24 hours following the procedure.  For the next 24 hours, DO NOT: -Drive a car -Paediatric nurse -Drink alcoholic beverages -Take any medication unless instructed by your physician -Make any legal decisions or sign important papers.  Meals: Start with liquid foods such as gelatin or soup.  Progress to regular foods as tolerated. Avoid greasy, spicy, heavy foods. If nausea and/or vomiting occur, drink only clear liquids until the nausea and/or vomiting subsides. Call your physician if vomiting continues.  Special Instructions/Symptoms: Your throat may feel dry or sore from the anesthesia or the breathing tube placed in your throat during surgery. If this causes discomfort, gargle with warm salt water. The discomfort should disappear within 24 hours.  CYSTOSCOPY HOME CARE INSTRUCTIONS  Activity: Rest for the remainder of the day.  Do not drive or operate equipment today.  You may resume normal activities in one to two days as instructed by your physician.   Meals: Drink plenty of liquids and eat light foods such as gelatin or soup this evening.  You may return to a normal meal plan tomorrow.  Return to Work: You may return to work in one to two days or as instructed by your physician.  Special Instructions / Symptoms: Call your physician if any of these symptoms occur:   -persistent or heavy bleeding  -bleeding which continues after first few urination  -large blood clots that are difficult to pass  -urine stream diminishes or stops completely  -fever equal to or higher than 101 degrees Farenheit.  -cloudy urine with a strong, foul odor  -severe pain  Females should always wipe from front to back after elimination.  You may feel some burning pain when you urinate.  This should disappear with time.  Applying moist heat to the lower abdomen or a hot tub bath may help relieve the pain. \  Follow-Up / Date of Return Visit to Your Physician:   Call for an appointment to arrange follow-up.  Patient Signature:  ________________________________________________________  Nurse's Signature:  ________________________________________________________

## 2015-05-18 NOTE — Op Note (Signed)
PATIENT:  Jody Mcclain  PRE-OPERATIVE DIAGNOSIS: Bladder tumors  POST-OPERATIVE DIAGNOSIS: Same  PROCEDURE:  Procedure(s): TRANSURETHRAL RESECTION OF BLADDER TUMOR (TURBT) (3cm.)  SURGEON:  Surgeon(s): Claybon Jabs  ANESTHESIA:   General  EBL:  Minimal  DRAINS: None  SPECIMEN:  Source of Specimen:  Bladder tumor  DISPOSITION OF SPECIMEN:  PATHOLOGY  Indication:   Description of operation: The patient was taken to the operating room and administered general anesthesia. She was then placed on the table and moved to the dorsal lithotomy position after which her genitalia was sterilely prepped and draped. An official timeout was then performed.  The 23 French resectoscope with the 30 lens and visual obturator were then passed through the urethra under direct visualization. Urethra appeared normal. The visual obturator was then removed and the Gyrus resectoscope element with 12 lens was then inserted and the bladder was fully and systematically inspected. Ureteral orifices were noted to be in the normal anatomic positions. She had a nodular lesion noted on the right posterior wall of the bladder as well as a second lesion that appeared possibly contiguous extending along the left wall of the bladder but well away from the bladder neck and ureteral orifice. I also noted a area of erythematous mucosa on the posterior wall of the bladder up on the right-hand side. All of her tumors were nodular without significant papillary component.  I first began by resecting the tumor on the left wall the bladder. Reinspection of the bladder revealed all obvious tumor had been fully resected and there was no evidence of perforation. I then obtained a small biopsy from the slightly erythematous region on the posterior, superior right wall of the bladder and sent this separately. The Microvasive evacuator was then used to irrigate the bladder and remove all of the portions of bladder tumor which were  sent to pathology. I then removed the resectoscope.  Due to the nodular appearance of the tumor and high probability of invasive disease I did not elect to instill mitomycin-C. I did perform a bimanual exam and was not able to appreciate any mass, fixation or nodularity.  PLAN OF CARE: Discharge to home after PACU  PATIENT DISPOSITION:  PACU - hemodynamically stable.

## 2015-05-18 NOTE — Transfer of Care (Signed)
Immediate Anesthesia Transfer of Care Note  Patient: Jody Mcclain  Procedure(s) Performed: Procedure(s): TRANSURETHRAL RESECTION OF BLADDER TUMOR WITH GYRUS (TURBT-GYRUS) (N/A)  Patient Location: PACU  Anesthesia Type:General  Level of Consciousness: awake, alert , oriented and patient cooperative  Airway & Oxygen Therapy: Patient Spontanous Breathing and Patient connected to nasal cannula oxygen  Post-op Assessment: Report given to RN and Post -op Vital signs reviewed and stable  Post vital signs: Reviewed and stable  Last Vitals:  Filed Vitals:   05/18/15 0835  BP: 151/51  Pulse: 71  Temp: 36.8 C  Resp: 16    Complications: No apparent anesthesia complications

## 2015-05-18 NOTE — Anesthesia Postprocedure Evaluation (Signed)
  Anesthesia Post-op Note  Patient: Jody Mcclain  Procedure(s) Performed: Procedure(s) (LRB): TRANSURETHRAL RESECTION OF BLADDER TUMOR WITH GYRUS (TURBT-GYRUS) (N/A)  Patient Location: PACU  Anesthesia Type: General  Level of Consciousness: awake and alert   Airway and Oxygen Therapy: Patient Spontanous Breathing  Post-op Pain: mild  Post-op Assessment: Post-op Vital signs reviewed, Patient's Cardiovascular Status Stable, Respiratory Function Stable, Patent Airway and No signs of Nausea or vomiting  Last Vitals:  Filed Vitals:   05/18/15 1030  BP: 138/78  Pulse: 79  Temp:   Resp: 12    Post-op Vital Signs: stable   Complications: No apparent anesthesia complications

## 2015-05-18 NOTE — Anesthesia Preprocedure Evaluation (Addendum)
Anesthesia Evaluation  Patient identified by MRN, date of birth, ID band Patient awake    Reviewed: Allergy & Precautions, H&P , NPO status , Patient's Chart, lab work & pertinent test results, reviewed documented beta blocker date and time   Airway Mallampati: II  TM Distance: >3 FB Neck ROM: full    Dental  (+) Edentulous Upper, Edentulous Lower, Dental Advisory Given   Pulmonary neg pulmonary ROS, former smoker,  breath sounds clear to auscultation  Pulmonary exam normal       Cardiovascular hypertension, Pt. on medications and Pt. on home beta blockers Normal cardiovascular exam+ Valvular Problems/Murmurs Rhythm:regular Rate:Normal  Severe TR   Neuro/Psych negative neurological ROS  negative psych ROS   GI/Hepatic negative GI ROS, Neg liver ROS, hiatal hernia, GERD-  Medicated and Poorly Controlled,  Endo/Other  negative endocrine ROSHypothyroidism   Renal/GU Renal diseaseStage 3 chronic kidney disease  negative genitourinary   Musculoskeletal   Abdominal   Peds  Hematology negative hematology ROS (+)   Anesthesia Other Findings   Reproductive/Obstetrics negative OB ROS                           Anesthesia Physical Anesthesia Plan  ASA: III  Anesthesia Plan: General   Post-op Pain Management:    Induction: Intravenous  Airway Management Planned: Oral ETT  Additional Equipment:   Intra-op Plan:   Post-operative Plan: Extubation in OR  Informed Consent: I have reviewed the patients History and Physical, chart, labs and discussed the procedure including the risks, benefits and alternatives for the proposed anesthesia with the patient or authorized representative who has indicated his/her understanding and acceptance.   Dental Advisory Given  Plan Discussed with: CRNA and Surgeon  Anesthesia Plan Comments:        Anesthesia Quick Evaluation

## 2015-05-21 ENCOUNTER — Encounter (HOSPITAL_BASED_OUTPATIENT_CLINIC_OR_DEPARTMENT_OTHER): Payer: Self-pay | Admitting: Urology

## 2015-05-21 ENCOUNTER — Ambulatory Visit: Payer: Self-pay

## 2015-05-28 ENCOUNTER — Other Ambulatory Visit: Payer: Self-pay | Admitting: Urology

## 2015-05-29 ENCOUNTER — Ambulatory Visit (HOSPITAL_COMMUNITY)
Admission: RE | Admit: 2015-05-29 | Discharge: 2015-05-29 | Disposition: A | Discharge status: Home / Self Care | Payer: Medicare Other | Source: Ambulatory Visit | Attending: Urology | Admitting: Urology

## 2015-05-29 DIAGNOSIS — K449 Diaphragmatic hernia without obstruction or gangrene: Secondary | ICD-10-CM | POA: Insufficient documentation

## 2015-05-29 DIAGNOSIS — D49519 Neoplasm of unspecified behavior of unspecified kidney: Secondary | ICD-10-CM

## 2015-05-29 DIAGNOSIS — N289 Disorder of kidney and ureter, unspecified: Secondary | ICD-10-CM | POA: Insufficient documentation

## 2015-05-29 MED ORDER — GADOBENATE DIMEGLUMINE 529 MG/ML IV SOLN
15.0000 mL | Freq: Once | INTRAVENOUS | Status: AC | PRN
  Administered 2015-05-29: 5 mL via INTRAVENOUS

## 2015-06-28 ENCOUNTER — Encounter (HOSPITAL_BASED_OUTPATIENT_CLINIC_OR_DEPARTMENT_OTHER): Payer: Self-pay | Admitting: *Deleted

## 2015-06-29 ENCOUNTER — Encounter (HOSPITAL_BASED_OUTPATIENT_CLINIC_OR_DEPARTMENT_OTHER): Payer: Self-pay | Admitting: *Deleted

## 2015-07-02 ENCOUNTER — Encounter (HOSPITAL_BASED_OUTPATIENT_CLINIC_OR_DEPARTMENT_OTHER): Payer: Self-pay | Admitting: *Deleted

## 2015-07-02 NOTE — Progress Notes (Signed)
NPO AFTER MN.  ARRIVE AT [id number].  NEEDS ISTAT.  CURRENT EKG IN CHART AND EPIC.  WILL TAKE SYNTHROID, PRILOSEC, AND METOPROLOL AM DOS W/ SIPS OF WATER.

## 2015-07-06 ENCOUNTER — Ambulatory Visit (HOSPITAL_BASED_OUTPATIENT_CLINIC_OR_DEPARTMENT_OTHER)
Admission: RE | Admit: 2015-07-06 | Discharge: 2015-07-06 | Disposition: A | Discharge status: Home / Self Care | Payer: Medicare Other | Source: Ambulatory Visit | Attending: Urology | Admitting: Urology

## 2015-07-06 ENCOUNTER — Ambulatory Visit (HOSPITAL_BASED_OUTPATIENT_CLINIC_OR_DEPARTMENT_OTHER): Payer: Medicare Other | Admitting: Anesthesiology

## 2015-07-06 ENCOUNTER — Encounter (HOSPITAL_BASED_OUTPATIENT_CLINIC_OR_DEPARTMENT_OTHER): Payer: Self-pay | Admitting: Anesthesiology

## 2015-07-06 ENCOUNTER — Encounter (HOSPITAL_BASED_OUTPATIENT_CLINIC_OR_DEPARTMENT_OTHER)
Admission: RE | Disposition: A | Discharge status: Home / Self Care | Payer: Self-pay | Source: Ambulatory Visit | Attending: Urology

## 2015-07-06 DIAGNOSIS — N281 Cyst of kidney, acquired: Secondary | ICD-10-CM | POA: Insufficient documentation

## 2015-07-06 DIAGNOSIS — N183 Chronic kidney disease, stage 3 (moderate): Secondary | ICD-10-CM | POA: Diagnosis not present

## 2015-07-06 DIAGNOSIS — N3281 Overactive bladder: Secondary | ICD-10-CM | POA: Insufficient documentation

## 2015-07-06 DIAGNOSIS — E780 Pure hypercholesterolemia: Secondary | ICD-10-CM | POA: Diagnosis not present

## 2015-07-06 DIAGNOSIS — R350 Frequency of micturition: Secondary | ICD-10-CM | POA: Insufficient documentation

## 2015-07-06 DIAGNOSIS — R351 Nocturia: Secondary | ICD-10-CM | POA: Insufficient documentation

## 2015-07-06 DIAGNOSIS — F419 Anxiety disorder, unspecified: Secondary | ICD-10-CM | POA: Diagnosis not present

## 2015-07-06 DIAGNOSIS — I082 Rheumatic disorders of both aortic and tricuspid valves: Secondary | ICD-10-CM | POA: Diagnosis not present

## 2015-07-06 DIAGNOSIS — Z888 Allergy status to other drugs, medicaments and biological substances status: Secondary | ICD-10-CM | POA: Diagnosis not present

## 2015-07-06 DIAGNOSIS — Z90710 Acquired absence of both cervix and uterus: Secondary | ICD-10-CM | POA: Diagnosis not present

## 2015-07-06 DIAGNOSIS — C674 Malignant neoplasm of posterior wall of bladder: Secondary | ICD-10-CM | POA: Insufficient documentation

## 2015-07-06 DIAGNOSIS — M1990 Unspecified osteoarthritis, unspecified site: Secondary | ICD-10-CM | POA: Diagnosis not present

## 2015-07-06 DIAGNOSIS — K219 Gastro-esophageal reflux disease without esophagitis: Secondary | ICD-10-CM | POA: Insufficient documentation

## 2015-07-06 DIAGNOSIS — Z882 Allergy status to sulfonamides status: Secondary | ICD-10-CM | POA: Insufficient documentation

## 2015-07-06 DIAGNOSIS — Z79899 Other long term (current) drug therapy: Secondary | ICD-10-CM | POA: Insufficient documentation

## 2015-07-06 DIAGNOSIS — I129 Hypertensive chronic kidney disease with stage 1 through stage 4 chronic kidney disease, or unspecified chronic kidney disease: Secondary | ICD-10-CM | POA: Insufficient documentation

## 2015-07-06 DIAGNOSIS — F17210 Nicotine dependence, cigarettes, uncomplicated: Secondary | ICD-10-CM | POA: Insufficient documentation

## 2015-07-06 DIAGNOSIS — N393 Stress incontinence (female) (male): Secondary | ICD-10-CM | POA: Diagnosis not present

## 2015-07-06 DIAGNOSIS — E039 Hypothyroidism, unspecified: Secondary | ICD-10-CM | POA: Insufficient documentation

## 2015-07-06 DIAGNOSIS — C679 Malignant neoplasm of bladder, unspecified: Secondary | ICD-10-CM

## 2015-07-06 DIAGNOSIS — Z881 Allergy status to other antibiotic agents status: Secondary | ICD-10-CM | POA: Insufficient documentation

## 2015-07-06 DIAGNOSIS — R3915 Urgency of urination: Secondary | ICD-10-CM | POA: Insufficient documentation

## 2015-07-06 HISTORY — DX: Neoplasm of unspecified behavior of unspecified kidney: D49.519

## 2015-07-06 HISTORY — PX: CYSTOSCOPY WITH BIOPSY: SHX5122

## 2015-07-06 HISTORY — DX: Malignant neoplasm of bladder, unspecified: C67.9

## 2015-07-06 LAB — POCT I-STAT 4, (NA,K, GLUC, HGB,HCT)
Glucose, Bld: 95 mg/dL (ref 65–99)
HCT: 41 % (ref 36.0–46.0)
Hemoglobin: 13.9 g/dL (ref 12.0–15.0)
Potassium: 4.6 mmol/L (ref 3.5–5.1)
Sodium: 142 mmol/L (ref 135–145)

## 2015-07-06 SURGERY — CYSTOSCOPY, WITH BIOPSY
Anesthesia: General | Site: Bladder

## 2015-07-06 MED ORDER — SODIUM CHLORIDE 0.9 % IR SOLN
Status: DC | PRN
  Administered 2015-07-06 (×2): 3000 mL via INTRAVESICAL

## 2015-07-06 MED ORDER — OXYBUTYNIN CHLORIDE 5 MG PO TABS
ORAL_TABLET | ORAL | Status: AC
  Filled 2015-07-06: qty 1

## 2015-07-06 MED ORDER — HYDROCODONE-ACETAMINOPHEN 10-325 MG PO TABS: 1.0000 | ORAL_TABLET | ORAL | Status: DC | PRN

## 2015-07-06 MED ORDER — HYDROCODONE-ACETAMINOPHEN 5-325 MG PO TABS
1.0000 | ORAL_TABLET | ORAL | Status: DC | PRN
  Administered 2015-07-06: 0.5 via ORAL
  Filled 2015-07-06: qty 1

## 2015-07-06 MED ORDER — OXYBUTYNIN CHLORIDE 5 MG PO TABS
5.0000 mg | ORAL_TABLET | Freq: Once | ORAL | Status: AC
  Administered 2015-07-06: 5 mg via ORAL
  Filled 2015-07-06: qty 1

## 2015-07-06 MED ORDER — MEPERIDINE HCL 25 MG/ML IJ SOLN
6.2500 mg | INTRAMUSCULAR | Status: DC | PRN
  Filled 2015-07-06: qty 1

## 2015-07-06 MED ORDER — FENTANYL CITRATE (PF) 100 MCG/2ML IJ SOLN
INTRAMUSCULAR | Status: AC
Start: 2015-07-06 — End: 2015-07-06
  Filled 2015-07-06: qty 4

## 2015-07-06 MED ORDER — DEXAMETHASONE SODIUM PHOSPHATE 4 MG/ML IJ SOLN
INTRAMUSCULAR | Status: DC | PRN
  Administered 2015-07-06: 4 mg via INTRAVENOUS

## 2015-07-06 MED ORDER — CIPROFLOXACIN IN D5W 200 MG/100ML IV SOLN
INTRAVENOUS | Status: AC
  Filled 2015-07-06: qty 100

## 2015-07-06 MED ORDER — ONDANSETRON HCL 4 MG/2ML IJ SOLN
INTRAMUSCULAR | Status: DC | PRN
  Administered 2015-07-06: 4 mg via INTRAVENOUS

## 2015-07-06 MED ORDER — PHENAZOPYRIDINE HCL 100 MG PO TABS
ORAL_TABLET | ORAL | Status: AC
  Filled 2015-07-06: qty 2

## 2015-07-06 MED ORDER — PROPOFOL 10 MG/ML IV BOLUS
INTRAVENOUS | Status: DC | PRN
  Administered 2015-07-06: 100 mg via INTRAVENOUS

## 2015-07-06 MED ORDER — HYDROCODONE-ACETAMINOPHEN 5-325 MG PO TABS
ORAL_TABLET | ORAL | Status: AC
  Filled 2015-07-06: qty 1

## 2015-07-06 MED ORDER — FENTANYL CITRATE (PF) 100 MCG/2ML IJ SOLN
INTRAMUSCULAR | Status: DC | PRN
  Administered 2015-07-06: 50 ug via INTRAVENOUS

## 2015-07-06 MED ORDER — FENTANYL CITRATE (PF) 100 MCG/2ML IJ SOLN
25.0000 ug | INTRAMUSCULAR | Status: DC | PRN
  Filled 2015-07-06: qty 1

## 2015-07-06 MED ORDER — SODIUM CHLORIDE 0.9 % IV SOLN
INTRAVENOUS | Status: DC
  Administered 2015-07-06: 09:00:00 via INTRAVENOUS
  Filled 2015-07-06: qty 1000

## 2015-07-06 MED ORDER — PROMETHAZINE HCL 25 MG/ML IJ SOLN
6.2500 mg | INTRAMUSCULAR | Status: DC | PRN
  Filled 2015-07-06: qty 1

## 2015-07-06 MED ORDER — EPHEDRINE SULFATE 50 MG/ML IJ SOLN
INTRAMUSCULAR | Status: DC | PRN
  Administered 2015-07-06: 5 mg via INTRAVENOUS
  Administered 2015-07-06: 10 mg via INTRAVENOUS

## 2015-07-06 MED ORDER — CIPROFLOXACIN IN D5W 200 MG/100ML IV SOLN
200.0000 mg | INTRAVENOUS | Status: AC
  Administered 2015-07-06: 200 mg via INTRAVENOUS
  Filled 2015-07-06: qty 100

## 2015-07-06 MED ORDER — LIDOCAINE HCL (CARDIAC) 20 MG/ML IV SOLN
INTRAVENOUS | Status: DC | PRN
  Administered 2015-07-06: 60 mg via INTRAVENOUS

## 2015-07-06 MED ORDER — PHENAZOPYRIDINE HCL 200 MG PO TABS
200.0000 mg | ORAL_TABLET | Freq: Once | ORAL | Status: AC
  Administered 2015-07-06: 200 mg via ORAL
  Filled 2015-07-06: qty 1

## 2015-07-06 MED ORDER — PHENAZOPYRIDINE HCL 200 MG PO TABS: 200.0000 mg | ORAL_TABLET | Freq: Three times a day (TID) | ORAL | Status: DC | PRN

## 2015-07-06 MED ORDER — MIDAZOLAM HCL 2 MG/2ML IJ SOLN
INTRAMUSCULAR | Status: AC
  Filled 2015-07-06: qty 2

## 2015-07-06 SURGICAL SUPPLY — 20 items
BAG DRAIN URO-CYSTO SKYTR STRL (DRAIN) ×2 IMPLANT
BAG DRN UROCATH (DRAIN) ×1
CLOTH BEACON ORANGE TIMEOUT ST (SAFETY) ×2 IMPLANT
ELECT REM PT RETURN 9FT ADLT (ELECTROSURGICAL) ×2
ELECTRODE REM PT RTRN 9FT ADLT (ELECTROSURGICAL) ×1 IMPLANT
GLOVE BIO SURGEON STRL SZ 6.5 (GLOVE) ×1 IMPLANT
GLOVE BIO SURGEON STRL SZ8 (GLOVE) ×2 IMPLANT
GLOVE INDICATOR 6.5 STRL GRN (GLOVE) ×1 IMPLANT
GOWN STRL REUS W/ TWL LRG LVL3 (GOWN DISPOSABLE) ×1 IMPLANT
GOWN STRL REUS W/ TWL XL LVL3 (GOWN DISPOSABLE) ×1 IMPLANT
GOWN STRL REUS W/TWL LRG LVL3 (GOWN DISPOSABLE) ×2
GOWN STRL REUS W/TWL XL LVL3 (GOWN DISPOSABLE) ×2
IV NS IRRIG 3000ML ARTHROMATIC (IV SOLUTION) ×2 IMPLANT
LOOP CUT BIPOLAR 24F LRG (ELECTROSURGICAL) ×1 IMPLANT
MANIFOLD NEPTUNE II (INSTRUMENTS) ×1 IMPLANT
NS IRRIG 500ML POUR BTL (IV SOLUTION) IMPLANT
PACK CYSTO (CUSTOM PROCEDURE TRAY) ×2 IMPLANT
SET ASPIRATION TUBING (TUBING) ×1 IMPLANT
TUBING SUCTION 1/4X6FT (MISCELLANEOUS) ×1 IMPLANT
WATER STERILE IRR 3000ML UROMA (IV SOLUTION) ×2 IMPLANT

## 2015-07-06 NOTE — Anesthesia Preprocedure Evaluation (Addendum)
Anesthesia Evaluation  Patient identified by MRN, date of birth, ID band Patient awake    Reviewed: Allergy & Precautions, NPO status , Patient's Chart, lab work & pertinent test results  Airway Mallampati: II  TM Distance: >3 FB Neck ROM: Full    Dental no notable dental hx.    Pulmonary neg shortness of breath, former smoker,  breath sounds clear to auscultation  Pulmonary exam normal       Cardiovascular hypertension, Pt. on medications and Pt. on home beta blockers + Valvular Problems/Murmurs (mod to severe AI, severe TR) AI Rhythm:Regular Rate:Normal + Diastolic murmurs    Neuro/Psych Anxiety negative neurological ROS  negative psych ROS   GI/Hepatic Neg liver ROS, hiatal hernia, GERD-  Medicated and Controlled,  Endo/Other  Hypothyroidism   Renal/GU Renal diseaseBladder CA  negative genitourinary   Musculoskeletal  (+) Arthritis -, Osteoarthritis,    Abdominal   Peds negative pediatric ROS (+)  Hematology negative hematology ROS (+)   Anesthesia Other Findings   Reproductive/Obstetrics negative OB ROS                            Anesthesia Physical Anesthesia Plan  ASA: III  Anesthesia Plan: General   Post-op Pain Management:    Induction: Intravenous  Airway Management Planned: LMA  Additional Equipment:   Intra-op Plan:   Post-operative Plan: Extubation in OR  Informed Consent: I have reviewed the patients History and Physical, chart, labs and discussed the procedure including the risks, benefits and alternatives for the proposed anesthesia with the patient or authorized representative who has indicated his/her understanding and acceptance.   Dental advisory given  Plan Discussed with: CRNA  Anesthesia Plan Comments:         Anesthesia Quick Evaluation

## 2015-07-06 NOTE — H&P (Signed)
Jody Mcclain is a 81 year old female with a history of bladder cancer.   History of Present Illness Bladder cancer: The patient has had an elevation of her creatinine to 1.5. This prompted a renal ultrasound on 04/26/15 which revealed changes of the kidneys consistent with medical renal disease, as well as a 1.6 cm echogenic focus within the bladder that did not appear to be mobile like a clot. She did experience gross hematuria. She has been a cigarette smoker (60-pack-year history).     Bilateral renal cysts: She was noted to have bilateral cysts by u/s in 6/16.    LUTS: She said for the past year now she has had nocturia 5-6 times. This is associated with frequency and urgency in the daytime. She also reported that she has discomfort in her bladder when it fills up and gets relief when she empties her bladder.  Tx:    Interval history: I resected all of her intravesical tumor although it appeared nodular and I thought it was most likely invasive and therefore did not instill postoperative mitomycin-C. She did very well after the surgery. She is not having any significant voiding symptoms at this time. No hematuria.     Past Medical History Problems  1. History of High cholesterol (E78.0) 2. History of arthritis (Z87.39) 3. History of esophageal reflux (Z87.19) 4. History of hypertension (Z86.79)  Surgical History Problems  1. History of Hysterectomy  Current Meds 1. AmLODIPine Besylate 10 MG Oral Tablet;  Therapy: (Recorded:28Jun2016) to Recorded 2. Aspirin Low Dose 81 MG TABS;  Therapy: (Recorded:28Jun2016) to Recorded 3. Hydrochlorothiazide 25 MG Oral Tablet;  Therapy: (Recorded:28Jun2016) to Recorded 4. Levothyroxine Sodium 50 MCG Oral Tablet;  Therapy: (Recorded:28Jun2016) to Recorded 5. Lovastatin 40 MG Oral Tablet;  Therapy: (Recorded:28Jun2016) to Recorded 6. Metoprolol Tartrate 25 MG Oral Tablet;  Therapy: (Recorded:28Jun2016) to Recorded 7. Myrbetriq 25 MG  Oral Tablet Extended Release 24 Hour; Take 1 tablet daily;  Therapy: [id number] to (Evaluate:12Jul2016); Last Rx:28Jun2016 Ordered 8. Omeprazole 40 MG Oral Capsule Delayed Release;  Therapy: (Recorded:28Jun2016) to Recorded 9. Zetia 10 MG Oral Tablet;  Therapy: (Recorded:28Jun2016) to Recorded  Allergies Medication  1. No Known Drug Allergies  Family History Problems  1. Family history of No significant past medical history : Mother  Social History Problems  1. Former smoker [phone])   [id number] years ago 2. Number of children   2 sons and 3 daughters 3. Retired 4. Widower  Review of Systems Genitourinary, constitutional, skin, eye, otolaryngeal, hematologic/lymphatic, cardiovascular, pulmonary, endocrine, musculoskeletal, gastrointestinal, neurological and psychiatric system(s) were reviewed and pertinent findings if present are noted and are otherwise negative.  Genitourinary: urinary frequency, nocturia and hematuria.  Gastrointestinal: heartburn and constipation.  Constitutional: feeling tired (fatigue).  Eyes: blurred vision.  ENT: sinus problems.  Cardiovascular: leg swelling.  Respiratory: shortness of breath.  Musculoskeletal: back pain.   Vitals Vital Signs  Height: 5 ft 1 in Weight: 124 lb  BMI Calculated: 23.43 BSA Calculated: 1.54 Blood Pressure: 158 / 66 Heart Rate: 75   Physical Exam Constitutional: Well nourished and well developed . No acute distress.  ENT:. The ears and nose are normal in appearance.  Neck: The appearance of the neck is normal and no neck mass is present.  Pulmonary: No respiratory distress and normal respiratory rhythm and effort.  Cardiovascular: Heart rate and rhythm are normal . No peripheral edema.  Abdomen: The abdomen is soft and nontender. No masses are palpated. No CVA tenderness. No hernias are palpable. No hepatosplenomegaly noted.  Genitourinary:  Chaperone Present: .  Examination of the external genitalia shows vulvar  atrophy, but normal female external genitalia and no lesions. The urethra is normal in appearance and not tender. There is no urethral mass. Vaginal exam demonstrates atrophy. The cervix is is absent. The uterus is absent. The bladder is non tender and not distended. The anus is normal on inspection. The perineum is normal on inspection.  Lymphatics: The femoral and inguinal nodes are not enlarged or tender.  Skin: Normal skin turgor, no visible rash and no visible skin lesions.  Neuro/Psych:. Mood and affect are appropriate.    Assessment   I went over her pathology report with her today. It has revealed invasive, high-grade transitional cell carcinoma with lymphovascular invasion. Despite what I felt was a fairly aggressive resection the pathologist was not able to identify muscularis propria in the specimen. The area on the posterior wall of the bladder revealed no evidence of cancer. We therefore discussed what would be considered standard of care which would be a repeat resection in order to obtain muscularis for staging purposes however this would be undertaken if further definitive therapy was being considered such as a radical cystectomy or sensitizing chemotherapy with radiation. I went over these 2 treatment options with her today in detail so that she would be fully informed. We also discussed the possible scenarios that could potentially occur if this was left untreated. These included progression to metastatic disease versus local recurrence with irritative voiding symptoms, hematuria and less likely pain. She is in remarkably good health for her age and told me that she would like to proceed with further evaluation and consideration of possible further treatment if necessary. I answered her questions and her granddaughter had many well-informed questions including whether a PET scan might be helpful and we discussed the fact that this is not particularly helpful in patients with transitional  cell carcinoma. We also discussed the presence of lymphovascular invasion and my feeling that this is a much worse prognostic indicator however there is no evidence of metastatic spread such as adenopathy or spread to any of the intra-abdominal organs.     She does have a possible lesion in the right kidney and while this could be metastatic disease I think the probability of that is low. It may be a secondary malignancy or could just be a cyst but we discussed evaluating this further with an MRI scan which revealed this appeared to be most likely a cyst.    Plan Repeat resection of the tumor base.

## 2015-07-06 NOTE — Op Note (Signed)
PATIENT:  Jody Mcclain  PRE-OPERATIVE DIAGNOSIS: History of high-grade, superficially invasive transitional cell carcinoma the bladder   POST-OPERATIVE DIAGNOSIS: Same  PROCEDURE: Transurethral resectional biopsy of the bladder  SURGEON:  Claybon Jabs  INDICATION: [name] is a 81 year old female who was found to have a nodular bladder tumor on the left wall of her bladder which was resected and found to be high-grade with lymphovascular invasion and what appeared to be superficial invasion but there was no identifiable muscle present. She is therefore brought back to the operating room for repeat resectional biopsy for further staging.  ANESTHESIA:  General  EBL:  Minimal  DRAINS: None  LOCAL MEDICATIONS USED:  None  SPECIMEN:  Resectional biopsies from the previous tumor site on the left wall as well as the left posterior wall.  Description of procedure: After informed consent the patient was taken to the operating room and placed on the table in a supine position. General anesthesia was then administered. Once fully anesthetized the patient was moved to the dorsal lithotomy position and the genitalia were sterilely prepped and draped in standard fashion. An official timeout was then performed.  The 28 French resectoscope with visual obturator and 30 lens was passed into the bladder and the bladder was fully and systematically inspected. The previous biopsy site on the right posterior wall was identified as well as the previous location of the bladder tumor with some fibrinous deposits adherent. There was some slight erythema of the mucosa on the posterior wall on the left-hand side noted today as well. Ureteral orifices were of normal configuration and position.  I inserted the resectoscope element and resected deeper into the wall of the bladder at the site of the previous tumor resection resecting all of the previous tumor bed with obvious muscle being identified  grossly. I then obtained resectional biopsies from the posterior wall on the left-hand side as well. Bleeding points were cauterized and the portions of the bladder that were resected were removed throughout the procedure and no further tissue was noted to be floating free within the bladder. There was no evidence of perforation and no bleeding. Bladder was then drained, the resectoscope removed and the patient was awakened and taken to the recovery room in stable and satisfactory condition. She tolerated procedure well no intraoperative complications.  PLAN OF CARE: Discharge to home after PACU  PATIENT DISPOSITION:  PACU - hemodynamically stable.

## 2015-07-06 NOTE — Anesthesia Postprocedure Evaluation (Signed)
  Anesthesia Post-op Note  Patient: Jody Mcclain  Procedure(s) Performed: Procedure(s) (LRB): CYSTOSCOPY WITH BLADDER BIOPSY (N/A)  Patient Location: PACU  Anesthesia Type: General  Level of Consciousness: awake and alert   Airway and Oxygen Therapy: Patient Spontanous Breathing  Post-op Pain: mild  Post-op Assessment: Post-op Vital signs reviewed, Patient's Cardiovascular Status Stable, Respiratory Function Stable, Patent Airway and No signs of Nausea or vomiting  Last Vitals:  Filed Vitals:   07/06/15 1201  BP: 128/57  Pulse: 55  Temp: 36.4 C  Resp: 16    Post-op Vital Signs: stable   Complications: No apparent anesthesia complications

## 2015-07-06 NOTE — Discharge Instructions (Signed)

## 2015-07-06 NOTE — Anesthesia Procedure Notes (Signed)
Procedure Name: LMA Insertion Date/Time: 07/06/2015 9:32 AM Performed by: Denna Haggard D Pre-anesthesia Checklist: Patient identified, Emergency Drugs available, Suction available and Patient being monitored Patient Re-evaluated:Patient Re-evaluated prior to inductionOxygen Delivery Method: Circle System Utilized Preoxygenation: Pre-oxygenation with 100% oxygen Intubation Type: IV induction Ventilation: Mask ventilation without difficulty LMA: LMA inserted LMA Size: 4.0 Number of attempts: 1 Airway Equipment and Method: Bite block Placement Confirmation: positive ETCO2 Tube secured with: Tape Dental Injury: Teeth and Oropharynx as per pre-operative assessment

## 2015-07-06 NOTE — Transfer of Care (Signed)
Immediate Anesthesia Transfer of Care Note  Patient: Jody Mcclain  Procedure(s) Performed: Procedure(s) (LRB): CYSTOSCOPY WITH BLADDER BIOPSY (N/A)  Patient Location: PACU  Anesthesia Type: General  Level of Consciousness: awake, oriented, sedated and patient cooperative  Airway & Oxygen Therapy: Patient Spontanous Breathing and Patient connected to face mask oxygen  Post-op Assessment: Report given to PACU RN and Post -op Vital signs reviewed and stable  Post vital signs: Reviewed and stable  Complications: No apparent anesthesia complications

## 2015-07-09 ENCOUNTER — Encounter (HOSPITAL_BASED_OUTPATIENT_CLINIC_OR_DEPARTMENT_OTHER): Payer: Self-pay | Admitting: Urology

## 2015-07-24 ENCOUNTER — Telehealth: Payer: Self-pay | Admitting: Oncology

## 2015-07-24 NOTE — Telephone Encounter (Signed)
new patient appt-s/w patient dtr [name] and gave np appt for 09/16 @ 1:45 w/Dr. Alen Blew Referring Dr. Kathie Rhodes Dx- Malignant neoplasm of lateral wall urinary bladder    Referral information scanned under media tab

## 2015-07-27 ENCOUNTER — Telehealth: Payer: Self-pay | Admitting: Oncology

## 2015-07-27 ENCOUNTER — Ambulatory Visit (HOSPITAL_BASED_OUTPATIENT_CLINIC_OR_DEPARTMENT_OTHER): Payer: Medicare Other | Admitting: Oncology

## 2015-07-27 ENCOUNTER — Ambulatory Visit (HOSPITAL_BASED_OUTPATIENT_CLINIC_OR_DEPARTMENT_OTHER): Payer: Medicare Other

## 2015-07-27 ENCOUNTER — Encounter: Payer: Self-pay | Admitting: Oncology

## 2015-07-27 VITALS — BP 145/51 | HR 67 | Temp 98.1°F | Resp 16 | Ht 61.0 in | Wt 124.6 lb

## 2015-07-27 DIAGNOSIS — C670 Malignant neoplasm of trigone of bladder: Secondary | ICD-10-CM | POA: Diagnosis not present

## 2015-07-27 DIAGNOSIS — D509 Iron deficiency anemia, unspecified: Secondary | ICD-10-CM | POA: Diagnosis not present

## 2015-07-27 LAB — COMPREHENSIVE METABOLIC PANEL (CC13)
ALBUMIN: 3.9 g/dL (ref 3.5–5.0)
ALK PHOS: 93 U/L (ref 40–150)
ALT: 6 U/L (ref 0–55)
AST: 15 U/L (ref 5–34)
Anion Gap: 7 mEq/L (ref 3–11)
BUN: 22.9 mg/dL (ref 7.0–26.0)
CHLORIDE: 108 meq/L (ref 98–109)
CO2: 25 mEq/L (ref 22–29)
CREATININE: 1.5 mg/dL — AB (ref 0.6–1.1)
Calcium: 9.2 mg/dL (ref 8.4–10.4)
EGFR: 31 mL/min/{1.73_m2} — ABNORMAL LOW (ref >90)
GLUCOSE: 122 mg/dL (ref 70–140)
POTASSIUM: 4.1 meq/L (ref 3.5–5.1)
SODIUM: 140 meq/L (ref 136–145)
Total Bilirubin: 0.5 mg/dL (ref 0.20–1.20)
Total Protein: 6.9 g/dL (ref 6.4–8.3)

## 2015-07-27 LAB — CBC WITH DIFFERENTIAL/PLATELET
BASO%: 2.6 % — AB (ref 0.0–2.0)
BASOS ABS: 0.1 10*3/uL (ref 0.0–0.1)
EOS%: 2.2 % (ref 0.0–7.0)
Eosinophils Absolute: 0.1 10*3/uL (ref 0.0–0.5)
HCT: 38.5 % (ref 34.8–46.6)
HEMOGLOBIN: 12.7 g/dL (ref 11.6–15.9)
LYMPH#: 0.9 10*3/uL (ref 0.9–3.3)
LYMPH%: 22.5 % (ref 14.0–49.7)
MCH: 28.5 pg (ref 25.1–34.0)
MCHC: 33.1 g/dL (ref 31.5–36.0)
MCV: 86.2 fL (ref 79.5–101.0)
MONO#: 0.3 10*3/uL (ref 0.1–0.9)
MONO%: 8.9 % (ref 0.0–14.0)
NEUT%: 63.8 % (ref 38.4–76.8)
NEUTROS ABS: 2.5 10*3/uL (ref 1.5–6.5)
Platelets: 218 10*3/uL (ref 145–400)
RBC: 4.47 10*6/uL (ref 3.70–5.45)
RDW: 15.5 % — AB (ref 11.2–14.5)
WBC: 3.9 10*3/uL (ref 3.9–10.3)

## 2015-07-27 MED ORDER — CAPECITABINE 500 MG PO TABS: ORAL_TABLET | ORAL | Status: DC

## 2015-07-27 NOTE — Progress Notes (Signed)
Please see consult note.  

## 2015-07-27 NOTE — Progress Notes (Signed)
I faxed biologics req for xeloda asst

## 2015-07-27 NOTE — Progress Notes (Signed)
Script for xeloda given to raquel in managed care.

## 2015-07-27 NOTE — Consult Note (Signed)
Reason for Referral: Bladder cancer.   HPI: 81 year old woman native of [place] where she lived the majority of her life. She currently resides with her daughter and remains to be in reasonable health. She does have history of hypertension and hypothyroidism but for the most part remains relatively healthy. She was found to have an elevated creatinine which prompted a renal ultrasound that was done in June 2016. She was found to have a 1.6 cm foci within the bladder that is suspicious for malignancy. She was also noted to have episodic hematuria. She was evaluated by urology and underwent a cystoscopy and a TURBT on 05/18/2015 under the care of Dr. Karsten Ro. The initial pathology showed transitional cell carcinoma without muscle invasion. A repeat biopsy on 07/06/2015 showed a high-grade tumor with invasion into the muscularis propria. CT scan of the abdomen and pelvis did not show any evidence of metastatic disease. She was also diagnosed with iron deficiency anemia and have been taken iron supplements. She does report some fatigue and occasional frequency and nocturia but otherwise asymptomatic. She does not report any flank pain or abdominal pain. Has not reported any constitutional symptoms of weight loss or appetite changes. She continues to have a reasonable performance status and ambulates without any difficulties. She does not drive but it tends to activities of daily living.  She does not report any headaches, blurry vision, syncope or seizures. He does not report any fevers, chills or sweats. She does not report any chest pain, palpitation, orthopnea she does report leg edema. She does not report any cough, hemoptysis or hematemesis. She does not report any nausea, vomiting, abdominal pain, hematochezia or bleeding per rectum. She does not report any frequency but does report hematuria and nocturia. She does not report any lymphadenopathy or petechiae. Remainder review of systems  unremarkable.   Past Medical History  Diagnosis Date  . Hypertension   . Hypothyroidism   . Hyperlipidemia   . Tricuspid valve regurgitation, nonrheumatic cardiologist-- dr Saralyn Pilar    severe per cardiology note  . Malaise and fatigue   . GERD (gastroesophageal reflux disease)   . History of hiatal hernia   . Hematuria   . Lower extremity edema   . SUI (stress urinary incontinence, female)   . Generalized weakness     bilateral legs  . CKD (chronic kidney disease), stage III     pt has been referred to nephrologist by PCP, appt. is later this month July 2016  . Chronic constipation   . OAB (overactive bladder)   . Full dentures   . Arthritis     back and joints  . 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 Renal neoplasm   :  Past Surgical History  Procedure Laterality Date  . Cataract extraction w/ intraocular lens  implant, bilateral  2001  . Vaginal hysterectomy  1969  . Transurethral resection of bladder tumor with gyrus (turbt-gyrus) N/A 05/18/2015    Procedure: TRANSURETHRAL RESECTION OF BLADDER TUMOR WITH GYRUS (TURBT-GYRUS);  Surgeon: Kathie Rhodes, MD;  Location: [facility];  Service: Urology;  Laterality: N/A;  . Transthoracic echocardiogram  04-06-2013   dr alexander paraschos    mild to moderate LVH,  ef 60%/  moderate AI , valve area 2.6cm^2/  moderate to severe TI/  mild MI  . Cystoscopy with biopsy N/A 07/06/2015    Procedure: CYSTOSCOPY WITH BLADDER BIOPSY;  Surgeon: Kathie Rhodes, MD;  Location: [facility];  Service: Urology;  Laterality: N/A;  :   Current outpatient prescriptions:  .  amLODipine (NORVASC) 10 MG tablet, Take 10 mg by mouth every morning. , Disp: , Rfl:  .  aspirin EC 81 MG tablet, Take 81 mg by mouth daily., Disp: , Rfl:  .  Bisacodyl (CORRECTIVE LAXATIVE PO), Take by mouth as needed., Disp: , Rfl:  .  Calcium  Carb-Cholecalciferol (CALCIUM 600 + D) 600-200 MG-UNIT TABS, Take 1 tablet by mouth daily. , Disp: , Rfl:  .  capecitabine (XELODA) 500 MG tablet, Take one tablet daily, Monday to Friday with radiation., Disp: 30 tablet, Rfl: 0 .  Cyanocobalamin (B-12) 1000 MCG/ML KIT, Inject as directed every 30 (thirty) days., Disp: , Rfl:  .  HYDROcodone-acetaminophen (NORCO) 10-325 MG per tablet, Take 1-2 tablets by mouth every 4 (four) hours as needed for moderate pain. Maximum dose per 24 hours - 8 pills, Disp: 20 tablet, Rfl: 0 .  HYDROcodone-acetaminophen (NORCO/VICODIN) 5-325 MG per tablet, Take 1 tablet by mouth every 6 (six) hours as needed for moderate pain., Disp: 30 tablet, Rfl: 0 .  levothyroxine (SYNTHROID, LEVOTHROID) 50 MCG tablet, Take 50 mcg by mouth daily before breakfast., Disp: , Rfl:  .  lovastatin (MEVACOR) 40 MG tablet, Take 40 mg by mouth every morning. , Disp: , Rfl:  .  metoprolol tartrate (LOPRESSOR) 25 MG tablet, Take 12.5 mg by mouth every morning. CAN TAKE ANOTHER 1/2TAB IF FEELS HEART RACING, Disp: , Rfl:  .  omeprazole (PRILOSEC) 40 MG capsule, Take 40 mg by mouth every morning. , Disp: , Rfl:  .  phenazopyridine (PYRIDIUM) 200 MG tablet, Take 1 tablet (200 mg total) by mouth 3 (three) times daily as needed for pain., Disp: 30 tablet, Rfl: 0 .  phenazopyridine (PYRIDIUM) 200 MG tablet, Take 1 tablet (200 mg total) by mouth 3 (three) times daily as needed for pain., Disp: 20 tablet, Rfl: 0:  Allergies  Allergen Reactions  . Augmentin [Amoxicillin-Pot Clavulanate] Nausea Only  . Boniva [Ibandronic Acid] Other (See Comments)    unknown  . Ceftin [Cefuroxime] Other (See Comments)  . Fosamax [Alendronate] Other (See Comments)    unknown  . Lipitor [Atorvastatin] Other (See Comments)    MUSCLE PAIN  . Sulfa Antibiotics Nausea Only  . Thiazide-Type Diuretics Other (See Comments)    unknown  :  No family history on file.:  Social History   Social History  . Marital Status:  Single    Spouse Name: N/A  . Number of Children: N/A  . Years of Education: N/A   Occupational History  . Not on file.   Social History Main Topics  . Smoking status: Former Smoker -- 2.00 packs/day for 20 years    Types: Cigarettes    Quit date: 05/15/1968  . Smokeless tobacco: Never Used  . Alcohol Use: No  . Drug Use: No  . Sexual Activity: Not on file   Other Topics Concern  . Not on file   Social History Narrative  :  Pertinent items are noted in HPI.  Exam: Blood pressure 145/51, pulse 67, temperature 98.1 F (36.7 C), temperature source Oral, resp. rate 16, height '5\' 1"'  (1.549 m), weight 124 lb 9.6 oz (56.518 kg), SpO2 100 %. General appearance: alert and cooperative Head: Normocephalic, without obvious abnormality Throat: lips, mucosa, and tongue normal; teeth and gums normal Neck: no adenopathy Back: negative Resp: clear to auscultation bilaterally Chest wall:  no tenderness Cardio: regular rate and rhythm, S1, S2 normal, no murmur, click, rub or gallop GI: soft, non-tender; bowel sounds normal; no masses,  no organomegaly Extremities: extremities normal, atraumatic, no cyanosis or edema Pulses: 2+ and symmetric Skin: Skin color, texture, turgor normal. No rashes or lesions Lymph nodes: Cervical, supraclavicular, and axillary nodes normal.  CBC    Component Value Date/Time   WBC 4.4 04/03/2015 1803   WBC 5.1 03/17/2013 1345   RBC 4.56 04/03/2015 1803   RBC 4.44 03/17/2013 1345   HGB 13.9 07/06/2015 0903   HGB 13.3 03/17/2013 1345   HCT 41.0 07/06/2015 0903   HCT 38.3 03/17/2013 1345   PLT 177 04/03/2015 1803   PLT 192 03/17/2013 1345   MCV 84.9 04/03/2015 1803   MCV 86 03/17/2013 1345   MCH 27.8 04/03/2015 1803   MCH 30.0 03/17/2013 1345   MCHC 32.7 04/03/2015 1803   MCHC 34.7 03/17/2013 1345   RDW 15.7* 04/03/2015 1803   RDW 13.6 03/17/2013 1345      Chemistry      Component Value Date/Time   NA 142 07/06/2015 0903   NA 137 03/17/2013  1345   K 4.6 07/06/2015 0903   K 4.3 03/17/2013 1345   CL 107 05/18/2015 0914   CL 103 03/17/2013 1345   CO2 27 04/03/2015 1803   CO2 26 03/17/2013 1345   BUN 20 05/18/2015 0914   BUN 28* 03/17/2013 1345   CREATININE 1.40* 05/18/2015 0914   CREATININE 1.45* 03/17/2013 1345      Component Value Date/Time   CALCIUM 8.9 04/03/2015 1803   CALCIUM 9.0 03/17/2013 1345   ALKPHOS 63 04/03/2015 1803   ALKPHOS 77 03/17/2013 1345   AST 19 04/03/2015 1803   AST 26 03/17/2013 1345   ALT 9* 04/03/2015 1803   ALT 13 03/17/2013 1345   BILITOT 0.3 04/03/2015 1803   BILITOT 0.5 03/17/2013 1345       Assessment and Plan:   81 year old woman with the following issues:  1. 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 She presented with hematuria and a biopsy confirmed the presence of high-grade, invasive transitional cell carcinoma with pathological staging at T2a. Options of treatment as well as the natural course of this disease was reviewed today with the patient, her daughter and her granddaughter.  The standard of care for this cancer at this stage would be a radical prostatectomy but certainly she would not be an ideal operative candidate. Alternative options would include definitive radiation therapy with chemotherapy as a sensitizer. I explained that this is not an ideal therapy can offer disease control and possible long-term disease control close to 50% of that time.  The role of hemotherapy specifically was discussed today. Different radiosensitizing agent were reviewed. Platinum-based therapy would be poorly tolerated for a lot of reasons. Her renal insufficiency would be prohibitive at this time. Oral Xeloda would be a reasonable option at this time. Dose reduction because of her renal function is certainly would be needed.  Risks and benefits of chemotherapy addition to radiation therapy were reviewed. Complications include nausea, vomiting, diarrhea, mucositis,  cytopenias among others were reviewed. Hand-foot syndrome is also common for this particular drug.  Overall I feel the patient's health is reasonable to consider at least attempt to palliate her cancer with these modalities. She has poor tolerance, this therapy can be discontinued.  I will refer her to radiation oncology for an opinion regarding the risks and benefits of radiation  treatment.  If she elects is to proceed, I will arrange for Xeloda to be started around that time. She will be taken at that 500 mg daily Monday through Friday with radiation therapy. Therapy will be on hold during the weekend or any radiation days off.  2. Iron deficiency anemia: I will check her hemoglobin and iron studies today and will supplement as needed. We will consider IV iron if she is intolerant to oral iron.  3. Renal insufficiency: Likely related to long-standing hypertension without evidence of hydronephrosis.  4. Follow-up: The next few weeks to assess her progress on treatment.

## 2015-07-27 NOTE — Telephone Encounter (Signed)
per po fto sch pt appt-gave pt copy of avs-sent back to lab °

## 2015-07-30 ENCOUNTER — Telehealth: Payer: Self-pay | Admitting: *Deleted

## 2015-07-30 LAB — FERRITIN CHCC: Ferritin: 43 ng/ml (ref 9–269)

## 2015-07-30 LAB — IRON AND TIBC CHCC
%SAT: 29 % (ref 21–57)
IRON: 92 ug/dL (ref 41–142)
TIBC: 324 ug/dL (ref 236–444)
UIBC: 231 ug/dL (ref 120–384)

## 2015-07-30 NOTE — Telephone Encounter (Signed)
Call received in [place] from [place] with [employer].  [facility] states pt's copay for xeloda is $120- and there is no copay assistance.  " does Dr Alen Blew want [place] to go ahead and send it "  Return call number given for [place] is [phone].  Please verify above is ok and contact [facility]- thank you

## 2015-08-01 ENCOUNTER — Encounter: Payer: Self-pay | Admitting: *Deleted

## 2015-08-01 NOTE — Progress Notes (Signed)
Spoke with Jody Mcclain in rad onc. She will have tiffany in medical recordsto set up for rad onc treatments m-f at [facility] in [place]. Spoke with patient's daughter [name], she is aware of co-pay for xeloda $120.00, okay for [employer] to deliver medication. She will expect a call from rad onc re: transferring those treatments to [place].

## 2015-08-03 NOTE — Telephone Encounter (Signed)
No entry 

## 2015-08-07 ENCOUNTER — Encounter: Payer: Self-pay | Admitting: Oncology

## 2015-08-07 NOTE — Progress Notes (Signed)
Per [place] at [employer] xeloda was delivered 08/03/15.

## 2015-08-09 ENCOUNTER — Ambulatory Visit: Payer: Medicare Other | Admitting: Radiation Oncology

## 2015-08-13 ENCOUNTER — Encounter: Payer: Self-pay | Admitting: Radiation Oncology

## 2015-08-13 ENCOUNTER — Ambulatory Visit
Admission: RE | Admit: 2015-08-13 | Discharge: 2015-08-13 | Disposition: A | Discharge status: Home / Self Care | Payer: Medicare Other | Source: Ambulatory Visit | Attending: Radiation Oncology | Admitting: Radiation Oncology

## 2015-08-13 VITALS — BP 150/65 | HR 68 | Temp 97.3°F | Resp 18 | Wt 126.4 lb

## 2015-08-13 DIAGNOSIS — E039 Hypothyroidism, unspecified: Secondary | ICD-10-CM | POA: Insufficient documentation

## 2015-08-13 DIAGNOSIS — E785 Hyperlipidemia, unspecified: Secondary | ICD-10-CM | POA: Insufficient documentation

## 2015-08-13 DIAGNOSIS — Z79890 Hormone replacement therapy (postmenopausal): Secondary | ICD-10-CM | POA: Insufficient documentation

## 2015-08-13 DIAGNOSIS — Z510 Encounter for antineoplastic radiation therapy: Secondary | ICD-10-CM | POA: Insufficient documentation

## 2015-08-13 DIAGNOSIS — Z79899 Other long term (current) drug therapy: Secondary | ICD-10-CM | POA: Insufficient documentation

## 2015-08-13 DIAGNOSIS — N183 Chronic kidney disease, stage 3 (moderate): Secondary | ICD-10-CM | POA: Insufficient documentation

## 2015-08-13 DIAGNOSIS — C679 Malignant neoplasm of bladder, unspecified: Secondary | ICD-10-CM | POA: Insufficient documentation

## 2015-08-13 DIAGNOSIS — I071 Rheumatic tricuspid insufficiency: Secondary | ICD-10-CM | POA: Insufficient documentation

## 2015-08-13 DIAGNOSIS — I129 Hypertensive chronic kidney disease with stage 1 through stage 4 chronic kidney disease, or unspecified chronic kidney disease: Secondary | ICD-10-CM | POA: Insufficient documentation

## 2015-08-13 DIAGNOSIS — Z87891 Personal history of nicotine dependence: Secondary | ICD-10-CM | POA: Insufficient documentation

## 2015-08-13 DIAGNOSIS — N3281 Overactive bladder: Secondary | ICD-10-CM | POA: Insufficient documentation

## 2015-08-13 DIAGNOSIS — C670 Malignant neoplasm of trigone of bladder: Secondary | ICD-10-CM

## 2015-08-13 DIAGNOSIS — N393 Stress incontinence (female) (male): Secondary | ICD-10-CM | POA: Insufficient documentation

## 2015-08-14 ENCOUNTER — Encounter: Payer: Self-pay | Admitting: Radiation Oncology

## 2015-08-14 NOTE — Consult Note (Signed)
Except an outstanding is perfect of Radiation Oncology NEW PATIENT EVALUATION  Name: Jody Mcclain  MRN: [id number]  Date:   08/13/2015     DOB: [birth year]   This 81 y.o. female patient presents to the clinic for initial evaluation of muscle invading bladder cancer. Stage TIIa NX M0.  REFERRING PHYSICIAN: Glendon Axe, MD  CHIEF COMPLAINT:  Chief Complaint  Patient presents with  . Bladder Cancer    Pt is here for initial consultation of bladder cancer.      DIAGNOSIS: The encounter diagnosis was Malignant neoplasm of trigone of bladder ([place]).   PREVIOUS INVESTIGATIONS:  CT scan requested for my review Pathology report reviewed Clinical notes reviewed  HPI: Patient is a 81 year old female who initially presented with elevated creatinine and renal ultrasound in June 2016. She was found to have a 1.6 cm lesion of the bladder suspicious for malignancy. Underwent TURBT on 05/18/2015 with pathology showing transitional cell carcinoma. Repeat biopsy showed high-grade tumor with invasion into the muscularis propria. CT scan of abdomen and pelvis which I have not yet reviewed did not show evidence any evidence of metastatic disease by clinical notes. She's currently on iron supplements for iron deficiency anemia causing constipation. She's having very little hematuria at this time. Based on her age radical cystectomy was not considered an option she's been seen by medical oncology in [place] and concurrent Xeloda with radiation therapy was recommended. She seen today for radiation oncology opinion. She does have long-standing history of hypertension which may be the cause of her renal insufficiency and elevated creatinine.  PLANNED TREATMENT REGIMEN: External beam radiation therapy along with oral Xeloda  PAST MEDICAL HISTORY:  has a past medical history of Hypertension; Hypothyroidism; Hyperlipidemia; Tricuspid valve regurgitation, nonrheumatic (cardiologist-- dr Saralyn Pilar); Malaise and  fatigue; GERD (gastroesophageal reflux disease); History of hiatal hernia; Hematuria; Lower extremity edema; SUI (stress urinary incontinence, female); Generalized weakness; CKD (chronic kidney disease), stage III; Chronic constipation; OAB (overactive bladder); Full dentures; Arthritis; Bladder cancer ([place]); and Renal neoplasm.    PAST SURGICAL HISTORY:  Past Surgical History  Procedure Laterality Date  . Cataract extraction w/ intraocular lens  implant, bilateral  2001  . Vaginal hysterectomy  1969  . Transurethral resection of bladder tumor with gyrus (turbt-gyrus) N/A 05/18/2015    Procedure: TRANSURETHRAL RESECTION OF BLADDER TUMOR WITH GYRUS (TURBT-GYRUS);  Surgeon: Kathie Rhodes, MD;  Location: [facility];  Service: Urology;  Laterality: N/A;  . Transthoracic echocardiogram  04-06-2013   dr alexander paraschos    mild to moderate LVH,  ef 60%/  moderate AI , valve area 2.6cm^2/  moderate to severe TI/  mild MI  . Cystoscopy with biopsy N/A 07/06/2015    Procedure: CYSTOSCOPY WITH BLADDER BIOPSY;  Surgeon: Kathie Rhodes, MD;  Location: [facility];  Service: Urology;  Laterality: N/A;    FAMILY HISTORY: family history is not on file.  SOCIAL HISTORY:  reports that she quit smoking about 47 years ago. Her smoking use included Cigarettes. She has a 40 pack-year smoking history. She has never used smokeless tobacco. She reports that she does not drink alcohol or use illicit drugs.  ALLERGIES: Augmentin; Boniva; Ceftin; Fosamax; Lipitor; Sulfa antibiotics; and Thiazide-type diuretics  MEDICATIONS:  Current Outpatient Prescriptions  Medication Sig Dispense Refill  . amLODipine (NORVASC) 10 MG tablet Take 10 mg by mouth every morning.     Marland Kitchen aspirin EC 81 MG tablet Take 81 mg by mouth daily.    . Bisacodyl (CORRECTIVE  LAXATIVE PO) Take by mouth as needed.    . Calcium Carb-Cholecalciferol (CALCIUM 600 + D) 600-200 MG-UNIT TABS Take 1 tablet by mouth daily.     .  capecitabine (XELODA) 500 MG tablet Take one tablet daily, Monday to Friday with radiation. 30 tablet 0  . conjugated estrogens (PREMARIN) vaginal cream Place vaginally.    . Cyanocobalamin (B-12) 1000 MCG/ML KIT Inject as directed every 30 (thirty) days.    . diazepam (VALIUM) 10 MG tablet     . ferrous sulfate 325 (65 FE) MG tablet TAKE 1 TABLET (325 MG TOTAL) BY MOUTH DAILY WITH BREAKFAST.  1  . hydrochlorothiazide (HYDRODIURIL) 25 MG tablet Take by mouth.    Marland Kitchen HYDROcodone-acetaminophen (NORCO) 10-325 MG per tablet Take 1-2 tablets by mouth every 4 (four) hours as needed for moderate pain. Maximum dose per 24 hours - 8 pills 20 tablet 0  . HYDROcodone-acetaminophen (NORCO/VICODIN) 5-325 MG per tablet Take 1 tablet by mouth every 6 (six) hours as needed for moderate pain. 30 tablet 0  . levothyroxine (SYNTHROID, LEVOTHROID) 50 MCG tablet Take 50 mcg by mouth daily before breakfast.    . lovastatin (MEVACOR) 40 MG tablet Take 40 mg by mouth every morning.     . metoprolol tartrate (LOPRESSOR) 25 MG tablet Take 12.5 mg by mouth every morning. CAN TAKE ANOTHER 1/2TAB IF FEELS HEART RACING    . omeprazole (PRILOSEC) 40 MG capsule Take 40 mg by mouth every morning.     . phenazopyridine (PYRIDIUM) 200 MG tablet Take 1 tablet (200 mg total) by mouth 3 (three) times daily as needed for pain. 30 tablet 0  . phenazopyridine (PYRIDIUM) 200 MG tablet Take 1 tablet (200 mg total) by mouth 3 (three) times daily as needed for pain. 20 tablet 0  . ZETIA 10 MG tablet Take 10 mg by mouth daily.  5   No current facility-administered medications for this encounter.    ECOG PERFORMANCE STATUS:  0 - Asymptomatic  REVIEW OF SYSTEMS:  Patient denies any weight loss, fatigue, weakness, fever, chills or night sweats. Patient denies any loss of vision, blurred vision. Patient denies any ringing  of the ears or hearing loss. No irregular heartbeat. Patient denies heart murmur or history of fainting. Patient denies any  chest pain or pain radiating to her upper extremities. Patient denies any shortness of breath, difficulty breathing at night, cough or hemoptysis. Patient denies any swelling in the lower legs. Patient denies any nausea vomiting, vomiting of blood, or coffee ground material in the vomitus. Patient denies any stomach pain. Patient states has had normal bowel movements no significant constipation or diarrhea. Patient denies any dysuria, hematuria or significant nocturia. Patient denies any problems walking, swelling in the joints or loss of balance. Patient denies any skin changes, loss of hair or loss of weight. Patient denies any excessive worrying or anxiety or significant depression. Patient denies any problems with insomnia. Patient denies excessive thirst, polyuria, polydipsia. Patient denies any swollen glands, patient denies easy bruising or easy bleeding. Patient denies any recent infections, allergies or URI. Patient "s visual fields have not changed significantly in recent time.    PHYSICAL EXAM: BP 150/65 mmHg  Pulse 68  Temp(Src) 97.3 F (36.3 C)  Resp 18  Wt 126 lb 6.9 oz (57.35 kg) Well-developed elderly female appears much younger than stated age. Abdomen is benign no superpubic tenderness is noted. No inguinal adenopathy is identified. Well-developed well-nourished patient in NAD. HEENT reveals PERLA, EOMI, discs not  visualized.  Oral cavity is clear. No oral mucosal lesions are identified. Neck is clear without evidence of cervical or supraclavicular adenopathy. Lungs are clear to A&P. Cardiac examination is essentially unremarkable with regular rate and rhythm without murmur rub or thrill. Abdomen is benign with no organomegaly or masses noted. Motor sensory and DTR levels are equal and symmetric in the upper and lower extremities. Cranial nerves II through XII are grossly intact. Proprioception is intact. No peripheral adenopathy or edema is identified. No motor or sensory levels are  noted. Crude visual fields are within normal range.  LABORATORY DATA: Surgical pathology reports are reviewed    RADIOLOGY RESULTS: MRI of abdomen and CT scans of abdomen and pelvis have been requested for my review   IMPRESSION: Stage TIIa muscle invading high-grade transitional cell carcinoma the bladder in 81 year old female for concurrent chemotherapy and radiation therapy  PLAN: I agree at this time based on her age and other comorbidities radical cystectomy would not be the treatment of choice. I believe we can control this disease with accommodation radiation therapy and chemotherapy. I would plan on delivering 4500 cGy to her bladder and surrounding lymph nodes boosting her bladder another 2000 cGy. Risks and benefits of treatment including increased lower urinary tract symptoms, fatigue, alteration of blood counts, skin reaction, diarrhea, all were discussed in detail with the patient and her family. They all seem to comprehend my treatment plan well. I have set up and ordered CT simulation for later this week. I've asked to be overnighted her CT scan of her abdomen pelvis for my review prior to CT simulation.  I would like to take this opportunity for allowing me to participate in the care of your patient.Armstead Peaks., MD

## 2015-08-16 ENCOUNTER — Ambulatory Visit
Admission: RE | Admit: 2015-08-16 | Discharge: 2015-08-16 | Disposition: A | Discharge status: Home / Self Care | Payer: Medicare Other | Source: Ambulatory Visit | Attending: Radiation Oncology | Admitting: Radiation Oncology

## 2015-08-16 DIAGNOSIS — N3281 Overactive bladder: Secondary | ICD-10-CM | POA: Diagnosis not present

## 2015-08-16 DIAGNOSIS — Z79899 Other long term (current) drug therapy: Secondary | ICD-10-CM | POA: Diagnosis not present

## 2015-08-16 DIAGNOSIS — N393 Stress incontinence (female) (male): Secondary | ICD-10-CM | POA: Diagnosis not present

## 2015-08-16 DIAGNOSIS — E039 Hypothyroidism, unspecified: Secondary | ICD-10-CM | POA: Diagnosis not present

## 2015-08-16 DIAGNOSIS — Z79890 Hormone replacement therapy (postmenopausal): Secondary | ICD-10-CM | POA: Diagnosis not present

## 2015-08-16 DIAGNOSIS — I129 Hypertensive chronic kidney disease with stage 1 through stage 4 chronic kidney disease, or unspecified chronic kidney disease: Secondary | ICD-10-CM | POA: Diagnosis not present

## 2015-08-16 DIAGNOSIS — Z510 Encounter for antineoplastic radiation therapy: Secondary | ICD-10-CM | POA: Diagnosis present

## 2015-08-16 DIAGNOSIS — Z87891 Personal history of nicotine dependence: Secondary | ICD-10-CM | POA: Diagnosis not present

## 2015-08-16 DIAGNOSIS — N183 Chronic kidney disease, stage 3 (moderate): Secondary | ICD-10-CM | POA: Diagnosis not present

## 2015-08-16 DIAGNOSIS — C679 Malignant neoplasm of bladder, unspecified: Secondary | ICD-10-CM | POA: Diagnosis not present

## 2015-08-16 DIAGNOSIS — E785 Hyperlipidemia, unspecified: Secondary | ICD-10-CM | POA: Diagnosis not present

## 2015-08-16 DIAGNOSIS — I071 Rheumatic tricuspid insufficiency: Secondary | ICD-10-CM | POA: Diagnosis not present

## 2015-08-20 ENCOUNTER — Telehealth: Payer: Self-pay | Admitting: *Deleted

## 2015-08-20 ENCOUNTER — Other Ambulatory Visit: Payer: Self-pay | Admitting: *Deleted

## 2015-08-20 DIAGNOSIS — C674 Malignant neoplasm of posterior wall of bladder: Secondary | ICD-10-CM

## 2015-08-20 NOTE — Telephone Encounter (Signed)
Per [name], daughter, she stated,"mom doesn't need to see Dr. Alen Blew on Wednesday, because she hasn't started any treatments. Can we reschedule lab/MD appointments?" Instructed [name] I would give this message to Dr. Alen Blew, and let him decide when he needs to see her. [name] verbalized understanding.

## 2015-08-21 DIAGNOSIS — Z510 Encounter for antineoplastic radiation therapy: Secondary | ICD-10-CM | POA: Diagnosis not present

## 2015-08-22 ENCOUNTER — Ambulatory Visit: Payer: Medicare Other | Admitting: Oncology

## 2015-08-22 ENCOUNTER — Other Ambulatory Visit: Payer: Medicare Other

## 2015-08-23 ENCOUNTER — Ambulatory Visit
Admission: RE | Admit: 2015-08-23 | Discharge: 2015-08-23 | Disposition: A | Discharge status: Home / Self Care | Payer: Medicare Other | Source: Ambulatory Visit | Attending: Radiation Oncology | Admitting: Radiation Oncology

## 2015-08-23 DIAGNOSIS — Z510 Encounter for antineoplastic radiation therapy: Secondary | ICD-10-CM | POA: Diagnosis not present

## 2015-08-27 ENCOUNTER — Ambulatory Visit
Admission: RE | Admit: 2015-08-27 | Discharge: 2015-08-27 | Disposition: A | Discharge status: Home / Self Care | Payer: Medicare Other | Source: Ambulatory Visit | Attending: Radiation Oncology | Admitting: Radiation Oncology

## 2015-08-27 DIAGNOSIS — Z510 Encounter for antineoplastic radiation therapy: Secondary | ICD-10-CM | POA: Diagnosis not present

## 2015-08-28 ENCOUNTER — Ambulatory Visit
Admission: RE | Admit: 2015-08-28 | Discharge: 2015-08-28 | Disposition: A | Discharge status: Home / Self Care | Payer: Medicare Other | Source: Ambulatory Visit | Attending: Radiation Oncology | Admitting: Radiation Oncology

## 2015-08-28 DIAGNOSIS — Z510 Encounter for antineoplastic radiation therapy: Secondary | ICD-10-CM | POA: Diagnosis not present

## 2015-08-29 ENCOUNTER — Ambulatory Visit
Admission: RE | Admit: 2015-08-29 | Discharge: 2015-08-29 | Disposition: A | Discharge status: Home / Self Care | Payer: Medicare Other | Source: Ambulatory Visit | Attending: Radiation Oncology | Admitting: Radiation Oncology

## 2015-08-29 DIAGNOSIS — Z510 Encounter for antineoplastic radiation therapy: Secondary | ICD-10-CM | POA: Diagnosis not present

## 2015-08-30 ENCOUNTER — Ambulatory Visit
Admission: RE | Admit: 2015-08-30 | Discharge: 2015-08-30 | Disposition: A | Discharge status: Home / Self Care | Payer: Medicare Other | Source: Ambulatory Visit | Attending: Radiation Oncology | Admitting: Radiation Oncology

## 2015-08-30 DIAGNOSIS — Z510 Encounter for antineoplastic radiation therapy: Secondary | ICD-10-CM | POA: Diagnosis not present

## 2015-08-31 ENCOUNTER — Ambulatory Visit
Admission: RE | Admit: 2015-08-31 | Discharge: 2015-08-31 | Disposition: A | Discharge status: Home / Self Care | Payer: Medicare Other | Source: Ambulatory Visit | Attending: Radiation Oncology | Admitting: Radiation Oncology

## 2015-08-31 DIAGNOSIS — Z510 Encounter for antineoplastic radiation therapy: Secondary | ICD-10-CM | POA: Diagnosis not present

## 2015-09-03 ENCOUNTER — Ambulatory Visit
Admission: RE | Admit: 2015-09-03 | Discharge: 2015-09-03 | Disposition: A | Discharge status: Home / Self Care | Payer: Medicare Other | Source: Ambulatory Visit | Attending: Radiation Oncology | Admitting: Radiation Oncology

## 2015-09-03 ENCOUNTER — Inpatient Hospital Stay: Payer: Medicare Other | Attending: Radiation Oncology

## 2015-09-03 DIAGNOSIS — C679 Malignant neoplasm of bladder, unspecified: Secondary | ICD-10-CM | POA: Diagnosis not present

## 2015-09-03 DIAGNOSIS — C674 Malignant neoplasm of posterior wall of bladder: Secondary | ICD-10-CM

## 2015-09-03 DIAGNOSIS — Z510 Encounter for antineoplastic radiation therapy: Secondary | ICD-10-CM | POA: Diagnosis not present

## 2015-09-03 LAB — CBC
HEMATOCRIT: 38.8 % (ref 35.0–47.0)
Hemoglobin: 13.1 g/dL (ref 12.0–16.0)
MCH: 29.2 pg (ref 26.0–34.0)
MCHC: 33.7 g/dL (ref 32.0–36.0)
MCV: 86.7 fL (ref 80.0–100.0)
Platelets: 174 10*3/uL (ref 150–440)
RBC: 4.48 MIL/uL (ref 3.80–5.20)
RDW: 14.7 % — AB (ref 11.5–14.5)
WBC: 4.7 10*3/uL (ref 3.6–11.0)

## 2015-09-04 ENCOUNTER — Ambulatory Visit
Admission: RE | Admit: 2015-09-04 | Discharge: 2015-09-04 | Disposition: A | Discharge status: Home / Self Care | Payer: Medicare Other | Source: Ambulatory Visit | Attending: Radiation Oncology | Admitting: Radiation Oncology

## 2015-09-04 DIAGNOSIS — Z510 Encounter for antineoplastic radiation therapy: Secondary | ICD-10-CM | POA: Diagnosis not present

## 2015-09-05 ENCOUNTER — Ambulatory Visit
Admission: RE | Admit: 2015-09-05 | Discharge: 2015-09-05 | Disposition: A | Discharge status: Home / Self Care | Payer: Medicare Other | Source: Ambulatory Visit | Attending: Radiation Oncology | Admitting: Radiation Oncology

## 2015-09-05 DIAGNOSIS — Z510 Encounter for antineoplastic radiation therapy: Secondary | ICD-10-CM | POA: Diagnosis not present

## 2015-09-06 ENCOUNTER — Ambulatory Visit
Admission: RE | Admit: 2015-09-06 | Discharge: 2015-09-06 | Disposition: A | Discharge status: Home / Self Care | Payer: Medicare Other | Source: Ambulatory Visit | Attending: Radiation Oncology | Admitting: Radiation Oncology

## 2015-09-06 DIAGNOSIS — Z510 Encounter for antineoplastic radiation therapy: Secondary | ICD-10-CM | POA: Diagnosis not present

## 2015-09-07 ENCOUNTER — Ambulatory Visit
Admission: RE | Admit: 2015-09-07 | Discharge: 2015-09-07 | Disposition: A | Discharge status: Home / Self Care | Payer: Medicare Other | Source: Ambulatory Visit | Attending: Radiation Oncology | Admitting: Radiation Oncology

## 2015-09-07 DIAGNOSIS — Z510 Encounter for antineoplastic radiation therapy: Secondary | ICD-10-CM | POA: Diagnosis not present

## 2015-09-09 ENCOUNTER — Ambulatory Visit
Admission: RE | Admit: 2015-09-09 | Discharge: 2015-09-09 | Disposition: A | Discharge status: Home / Self Care | Payer: Medicare Other | Source: Ambulatory Visit | Attending: Radiation Oncology | Admitting: Radiation Oncology

## 2015-09-10 ENCOUNTER — Other Ambulatory Visit: Payer: Self-pay | Admitting: *Deleted

## 2015-09-10 ENCOUNTER — Ambulatory Visit
Admission: RE | Admit: 2015-09-10 | Discharge: 2015-09-10 | Disposition: A | Discharge status: Home / Self Care | Payer: Medicare Other | Source: Ambulatory Visit | Attending: Radiation Oncology | Admitting: Radiation Oncology

## 2015-09-10 ENCOUNTER — Inpatient Hospital Stay: Payer: Medicare Other

## 2015-09-10 DIAGNOSIS — Z510 Encounter for antineoplastic radiation therapy: Secondary | ICD-10-CM | POA: Diagnosis not present

## 2015-09-10 DIAGNOSIS — C674 Malignant neoplasm of posterior wall of bladder: Secondary | ICD-10-CM

## 2015-09-10 DIAGNOSIS — C679 Malignant neoplasm of bladder, unspecified: Secondary | ICD-10-CM | POA: Diagnosis not present

## 2015-09-10 LAB — CBC
HCT: 39.1 % (ref 35.0–47.0)
Hemoglobin: 13.2 g/dL (ref 12.0–16.0)
MCH: 29.3 pg (ref 26.0–34.0)
MCHC: 33.7 g/dL (ref 32.0–36.0)
MCV: 86.9 fL (ref 80.0–100.0)
PLATELETS: 160 10*3/uL (ref 150–440)
RBC: 4.5 MIL/uL (ref 3.80–5.20)
RDW: 14.5 % (ref 11.5–14.5)
WBC: 3.5 10*3/uL — AB (ref 3.6–11.0)

## 2015-09-10 MED ORDER — PROMETHAZINE HCL 25 MG PO TABS: 25.0000 mg | ORAL_TABLET | Freq: Four times a day (QID) | ORAL | Status: DC | PRN

## 2015-09-11 ENCOUNTER — Ambulatory Visit
Admission: RE | Admit: 2015-09-11 | Discharge: 2015-09-11 | Disposition: A | Discharge status: Home / Self Care | Payer: Medicare Other | Source: Ambulatory Visit | Attending: Radiation Oncology | Admitting: Radiation Oncology

## 2015-09-11 DIAGNOSIS — Z510 Encounter for antineoplastic radiation therapy: Secondary | ICD-10-CM | POA: Diagnosis not present

## 2015-09-12 ENCOUNTER — Ambulatory Visit
Admission: RE | Admit: 2015-09-12 | Discharge: 2015-09-12 | Disposition: A | Discharge status: Home / Self Care | Payer: Medicare Other | Source: Ambulatory Visit | Attending: Radiation Oncology | Admitting: Radiation Oncology

## 2015-09-12 DIAGNOSIS — Z510 Encounter for antineoplastic radiation therapy: Secondary | ICD-10-CM | POA: Diagnosis not present

## 2015-09-13 ENCOUNTER — Ambulatory Visit
Admission: RE | Admit: 2015-09-13 | Discharge: 2015-09-13 | Disposition: A | Discharge status: Home / Self Care | Payer: Medicare Other | Source: Ambulatory Visit | Attending: Radiation Oncology | Admitting: Radiation Oncology

## 2015-09-13 DIAGNOSIS — Z510 Encounter for antineoplastic radiation therapy: Secondary | ICD-10-CM | POA: Diagnosis not present

## 2015-09-14 ENCOUNTER — Inpatient Hospital Stay: Payer: Medicare Other | Attending: Radiation Oncology

## 2015-09-14 ENCOUNTER — Ambulatory Visit
Admission: RE | Admit: 2015-09-14 | Discharge: 2015-09-14 | Disposition: A | Discharge status: Home / Self Care | Payer: Medicare Other | Source: Ambulatory Visit | Attending: Radiation Oncology | Admitting: Radiation Oncology

## 2015-09-14 ENCOUNTER — Other Ambulatory Visit: Payer: Self-pay | Admitting: *Deleted

## 2015-09-14 DIAGNOSIS — R309 Painful micturition, unspecified: Secondary | ICD-10-CM

## 2015-09-14 DIAGNOSIS — Z510 Encounter for antineoplastic radiation therapy: Secondary | ICD-10-CM | POA: Diagnosis not present

## 2015-09-14 DIAGNOSIS — C679 Malignant neoplasm of bladder, unspecified: Secondary | ICD-10-CM | POA: Insufficient documentation

## 2015-09-14 LAB — URINALYSIS COMPLETE WITH MICROSCOPIC (ARMC ONLY)
BILIRUBIN URINE: NEGATIVE
Bacteria, UA: NONE SEEN
Glucose, UA: NEGATIVE mg/dL
Ketones, ur: NEGATIVE mg/dL
NITRITE: NEGATIVE
PH: 5 (ref 5.0–8.0)
PROTEIN: 100 mg/dL — AB
SPECIFIC GRAVITY, URINE: 1.013 (ref 1.005–1.030)

## 2015-09-14 MED ORDER — PHENAZOPYRIDINE HCL 200 MG PO TABS: 200.0000 mg | ORAL_TABLET | Freq: Three times a day (TID) | ORAL | Status: DC | PRN

## 2015-09-17 ENCOUNTER — Inpatient Hospital Stay: Payer: Medicare Other

## 2015-09-17 ENCOUNTER — Ambulatory Visit
Admission: RE | Admit: 2015-09-17 | Discharge: 2015-09-17 | Disposition: A | Discharge status: Home / Self Care | Payer: Medicare Other | Source: Ambulatory Visit | Attending: Radiation Oncology | Admitting: Radiation Oncology

## 2015-09-17 DIAGNOSIS — Z510 Encounter for antineoplastic radiation therapy: Secondary | ICD-10-CM | POA: Diagnosis not present

## 2015-09-17 DIAGNOSIS — C674 Malignant neoplasm of posterior wall of bladder: Secondary | ICD-10-CM

## 2015-09-17 DIAGNOSIS — C679 Malignant neoplasm of bladder, unspecified: Secondary | ICD-10-CM | POA: Diagnosis not present

## 2015-09-17 LAB — CBC
HCT: 38.7 % (ref 35.0–47.0)
Hemoglobin: 13.2 g/dL (ref 12.0–16.0)
MCH: 29.7 pg (ref 26.0–34.0)
MCHC: 34.1 g/dL (ref 32.0–36.0)
MCV: 87.2 fL (ref 80.0–100.0)
PLATELETS: 163 10*3/uL (ref 150–440)
RBC: 4.44 MIL/uL (ref 3.80–5.20)
RDW: 14.6 % — AB (ref 11.5–14.5)
WBC: 4 10*3/uL (ref 3.6–11.0)

## 2015-09-18 ENCOUNTER — Ambulatory Visit
Admission: RE | Admit: 2015-09-18 | Discharge: 2015-09-18 | Disposition: A | Discharge status: Home / Self Care | Payer: Medicare Other | Source: Ambulatory Visit | Attending: Radiation Oncology | Admitting: Radiation Oncology

## 2015-09-18 ENCOUNTER — Other Ambulatory Visit: Payer: Self-pay | Admitting: *Deleted

## 2015-09-18 DIAGNOSIS — Z510 Encounter for antineoplastic radiation therapy: Secondary | ICD-10-CM | POA: Diagnosis not present

## 2015-09-19 ENCOUNTER — Ambulatory Visit
Admission: RE | Admit: 2015-09-19 | Discharge: 2015-09-19 | Disposition: A | Discharge status: Home / Self Care | Payer: Medicare Other | Source: Ambulatory Visit | Attending: Radiation Oncology | Admitting: Radiation Oncology

## 2015-09-19 DIAGNOSIS — Z510 Encounter for antineoplastic radiation therapy: Secondary | ICD-10-CM | POA: Diagnosis not present

## 2015-09-20 ENCOUNTER — Ambulatory Visit
Admission: RE | Admit: 2015-09-20 | Discharge: 2015-09-20 | Disposition: A | Discharge status: Home / Self Care | Payer: Medicare Other | Source: Ambulatory Visit | Attending: Radiation Oncology | Admitting: Radiation Oncology

## 2015-09-20 ENCOUNTER — Other Ambulatory Visit: Payer: Self-pay | Admitting: *Deleted

## 2015-09-20 DIAGNOSIS — Z510 Encounter for antineoplastic radiation therapy: Secondary | ICD-10-CM | POA: Diagnosis not present

## 2015-09-21 ENCOUNTER — Ambulatory Visit: Payer: Medicare Other

## 2015-09-24 ENCOUNTER — Other Ambulatory Visit: Payer: Self-pay | Admitting: *Deleted

## 2015-09-24 ENCOUNTER — Inpatient Hospital Stay: Payer: Medicare Other

## 2015-09-24 ENCOUNTER — Ambulatory Visit
Admission: RE | Admit: 2015-09-24 | Discharge: 2015-09-24 | Disposition: A | Discharge status: Home / Self Care | Payer: Medicare Other | Source: Ambulatory Visit | Attending: Radiation Oncology | Admitting: Radiation Oncology

## 2015-09-24 DIAGNOSIS — C679 Malignant neoplasm of bladder, unspecified: Secondary | ICD-10-CM | POA: Diagnosis not present

## 2015-09-24 DIAGNOSIS — C674 Malignant neoplasm of posterior wall of bladder: Secondary | ICD-10-CM

## 2015-09-24 DIAGNOSIS — Z510 Encounter for antineoplastic radiation therapy: Secondary | ICD-10-CM | POA: Diagnosis not present

## 2015-09-24 LAB — CBC
HCT: 40.5 % (ref 35.0–47.0)
Hemoglobin: 13.6 g/dL (ref 12.0–16.0)
MCH: 29.6 pg (ref 26.0–34.0)
MCHC: 33.6 g/dL (ref 32.0–36.0)
MCV: 88.3 fL (ref 80.0–100.0)
PLATELETS: 184 10*3/uL (ref 150–440)
RBC: 4.58 MIL/uL (ref 3.80–5.20)
RDW: 15.1 % — AB (ref 11.5–14.5)
WBC: 3.8 10*3/uL (ref 3.6–11.0)

## 2015-09-24 MED ORDER — CIPROFLOXACIN HCL 500 MG PO TABS: 500.0000 mg | ORAL_TABLET | Freq: Two times a day (BID) | ORAL | Status: DC

## 2015-09-24 MED ORDER — MIRABEGRON ER 25 MG PO TB24: 25.0000 mg | ORAL_TABLET | Freq: Every day | ORAL | Status: DC

## 2015-09-25 ENCOUNTER — Ambulatory Visit
Admission: RE | Admit: 2015-09-25 | Discharge: 2015-09-25 | Disposition: A | Discharge status: Home / Self Care | Payer: Medicare Other | Source: Ambulatory Visit | Attending: Radiation Oncology | Admitting: Radiation Oncology

## 2015-09-25 DIAGNOSIS — Z510 Encounter for antineoplastic radiation therapy: Secondary | ICD-10-CM | POA: Diagnosis not present

## 2015-09-26 ENCOUNTER — Ambulatory Visit
Admission: RE | Admit: 2015-09-26 | Discharge: 2015-09-26 | Disposition: A | Discharge status: Home / Self Care | Payer: Medicare Other | Source: Ambulatory Visit | Attending: Radiation Oncology | Admitting: Radiation Oncology

## 2015-09-26 DIAGNOSIS — Z510 Encounter for antineoplastic radiation therapy: Secondary | ICD-10-CM | POA: Diagnosis not present

## 2015-09-27 ENCOUNTER — Ambulatory Visit
Admission: RE | Admit: 2015-09-27 | Discharge: 2015-09-27 | Disposition: A | Discharge status: Home / Self Care | Payer: Medicare Other | Source: Ambulatory Visit | Attending: Radiation Oncology | Admitting: Radiation Oncology

## 2015-09-27 DIAGNOSIS — Z510 Encounter for antineoplastic radiation therapy: Secondary | ICD-10-CM | POA: Diagnosis not present

## 2015-09-28 ENCOUNTER — Ambulatory Visit
Admission: RE | Admit: 2015-09-28 | Discharge: 2015-09-28 | Disposition: A | Discharge status: Home / Self Care | Payer: Medicare Other | Source: Ambulatory Visit | Attending: Radiation Oncology | Admitting: Radiation Oncology

## 2015-09-28 DIAGNOSIS — Z510 Encounter for antineoplastic radiation therapy: Secondary | ICD-10-CM | POA: Diagnosis not present

## 2015-10-01 ENCOUNTER — Ambulatory Visit
Admission: RE | Admit: 2015-10-01 | Discharge: 2015-10-01 | Disposition: A | Discharge status: Home / Self Care | Payer: Medicare Other | Source: Ambulatory Visit | Attending: Radiation Oncology | Admitting: Radiation Oncology

## 2015-10-01 ENCOUNTER — Inpatient Hospital Stay: Payer: Medicare Other

## 2015-10-01 ENCOUNTER — Ambulatory Visit: Admission: RE | Admit: 2015-10-01 | Payer: Medicare Other | Source: Ambulatory Visit

## 2015-10-01 ENCOUNTER — Other Ambulatory Visit: Payer: Self-pay | Admitting: *Deleted

## 2015-10-01 ENCOUNTER — Encounter: Payer: Self-pay | Admitting: *Deleted

## 2015-10-01 DIAGNOSIS — Z510 Encounter for antineoplastic radiation therapy: Secondary | ICD-10-CM | POA: Diagnosis not present

## 2015-10-01 DIAGNOSIS — C679 Malignant neoplasm of bladder, unspecified: Secondary | ICD-10-CM | POA: Diagnosis not present

## 2015-10-01 DIAGNOSIS — C674 Malignant neoplasm of posterior wall of bladder: Secondary | ICD-10-CM

## 2015-10-01 LAB — CBC
HEMATOCRIT: 37.5 % (ref 35.0–47.0)
HEMOGLOBIN: 12.8 g/dL (ref 12.0–16.0)
MCH: 29.9 pg (ref 26.0–34.0)
MCHC: 34.1 g/dL (ref 32.0–36.0)
MCV: 87.8 fL (ref 80.0–100.0)
Platelets: 206 10*3/uL (ref 150–440)
RBC: 4.28 MIL/uL (ref 3.80–5.20)
RDW: 15.5 % — ABNORMAL HIGH (ref 11.5–14.5)
WBC: 3.5 10*3/uL — ABNORMAL LOW (ref 3.6–11.0)

## 2015-10-01 MED ORDER — CAPECITABINE 500 MG PO TABS: ORAL_TABLET | ORAL | Status: DC

## 2015-10-02 ENCOUNTER — Ambulatory Visit
Admission: RE | Admit: 2015-10-02 | Discharge: 2015-10-02 | Disposition: A | Discharge status: Home / Self Care | Payer: Medicare Other | Source: Ambulatory Visit | Attending: Radiation Oncology | Admitting: Radiation Oncology

## 2015-10-02 DIAGNOSIS — Z510 Encounter for antineoplastic radiation therapy: Secondary | ICD-10-CM | POA: Diagnosis not present

## 2015-10-03 ENCOUNTER — Ambulatory Visit
Admission: RE | Admit: 2015-10-03 | Discharge: 2015-10-03 | Disposition: A | Discharge status: Home / Self Care | Payer: Medicare Other | Source: Ambulatory Visit | Attending: Radiation Oncology | Admitting: Radiation Oncology

## 2015-10-03 DIAGNOSIS — Z510 Encounter for antineoplastic radiation therapy: Secondary | ICD-10-CM | POA: Diagnosis not present

## 2015-10-08 ENCOUNTER — Inpatient Hospital Stay: Payer: Medicare Other

## 2015-10-08 ENCOUNTER — Ambulatory Visit
Admission: RE | Admit: 2015-10-08 | Discharge: 2015-10-08 | Disposition: A | Discharge status: Home / Self Care | Payer: Medicare Other | Source: Ambulatory Visit | Attending: Radiation Oncology | Admitting: Radiation Oncology

## 2015-10-08 ENCOUNTER — Other Ambulatory Visit: Payer: Self-pay | Admitting: Oncology

## 2015-10-08 DIAGNOSIS — C679 Malignant neoplasm of bladder, unspecified: Secondary | ICD-10-CM | POA: Diagnosis not present

## 2015-10-08 DIAGNOSIS — C674 Malignant neoplasm of posterior wall of bladder: Secondary | ICD-10-CM

## 2015-10-08 DIAGNOSIS — Z510 Encounter for antineoplastic radiation therapy: Secondary | ICD-10-CM | POA: Diagnosis not present

## 2015-10-08 DIAGNOSIS — C670 Malignant neoplasm of trigone of bladder: Secondary | ICD-10-CM

## 2015-10-08 LAB — CBC
HCT: 39.3 % (ref 35.0–47.0)
HEMOGLOBIN: 13.3 g/dL (ref 12.0–16.0)
MCH: 29.7 pg (ref 26.0–34.0)
MCHC: 33.7 g/dL (ref 32.0–36.0)
MCV: 87.9 fL (ref 80.0–100.0)
PLATELETS: 216 10*3/uL (ref 150–440)
RBC: 4.47 MIL/uL (ref 3.80–5.20)
RDW: 16.1 % — AB (ref 11.5–14.5)
WBC: 3.7 10*3/uL (ref 3.6–11.0)

## 2015-10-08 MED ORDER — CAPECITABINE 500 MG PO TABS
ORAL_TABLET | ORAL | Status: DC
Start: 2015-10-08 — End: 2016-09-29

## 2015-10-09 ENCOUNTER — Ambulatory Visit
Admission: RE | Admit: 2015-10-09 | Discharge: 2015-10-09 | Disposition: A | Discharge status: Home / Self Care | Payer: Medicare Other | Source: Ambulatory Visit | Attending: Radiation Oncology | Admitting: Radiation Oncology

## 2015-10-09 DIAGNOSIS — Z510 Encounter for antineoplastic radiation therapy: Secondary | ICD-10-CM | POA: Diagnosis not present

## 2015-10-10 ENCOUNTER — Ambulatory Visit
Admission: RE | Admit: 2015-10-10 | Discharge: 2015-10-10 | Disposition: A | Discharge status: Home / Self Care | Payer: Medicare Other | Source: Ambulatory Visit | Attending: Radiation Oncology | Admitting: Radiation Oncology

## 2015-10-10 DIAGNOSIS — Z510 Encounter for antineoplastic radiation therapy: Secondary | ICD-10-CM | POA: Diagnosis not present

## 2015-10-11 ENCOUNTER — Ambulatory Visit
Admission: RE | Admit: 2015-10-11 | Discharge: 2015-10-11 | Disposition: A | Discharge status: Home / Self Care | Payer: Medicare Other | Source: Ambulatory Visit | Attending: Radiation Oncology | Admitting: Radiation Oncology

## 2015-10-11 DIAGNOSIS — Z510 Encounter for antineoplastic radiation therapy: Secondary | ICD-10-CM | POA: Diagnosis not present

## 2015-10-12 ENCOUNTER — Ambulatory Visit
Admission: RE | Admit: 2015-10-12 | Discharge: 2015-10-12 | Disposition: A | Discharge status: Home / Self Care | Payer: Medicare Other | Source: Ambulatory Visit | Attending: Radiation Oncology | Admitting: Radiation Oncology

## 2015-10-12 DIAGNOSIS — Z510 Encounter for antineoplastic radiation therapy: Secondary | ICD-10-CM | POA: Diagnosis not present

## 2015-10-15 ENCOUNTER — Ambulatory Visit
Admission: RE | Admit: 2015-10-15 | Discharge: 2015-10-15 | Disposition: A | Discharge status: Home / Self Care | Payer: Medicare Other | Source: Ambulatory Visit | Attending: Radiation Oncology | Admitting: Radiation Oncology

## 2015-10-15 DIAGNOSIS — Z510 Encounter for antineoplastic radiation therapy: Secondary | ICD-10-CM | POA: Diagnosis not present

## 2015-10-16 ENCOUNTER — Ambulatory Visit
Admission: RE | Admit: 2015-10-16 | Discharge: 2015-10-16 | Disposition: A | Discharge status: Home / Self Care | Payer: Medicare Other | Source: Ambulatory Visit | Attending: Radiation Oncology | Admitting: Radiation Oncology

## 2015-10-16 DIAGNOSIS — Z510 Encounter for antineoplastic radiation therapy: Secondary | ICD-10-CM | POA: Diagnosis not present

## 2015-10-17 ENCOUNTER — Ambulatory Visit
Admission: RE | Admit: 2015-10-17 | Discharge: 2015-10-17 | Disposition: A | Discharge status: Home / Self Care | Payer: Medicare Other | Source: Ambulatory Visit | Attending: Radiation Oncology | Admitting: Radiation Oncology

## 2015-10-17 ENCOUNTER — Ambulatory Visit: Payer: Medicare Other

## 2015-10-17 DIAGNOSIS — Z510 Encounter for antineoplastic radiation therapy: Secondary | ICD-10-CM | POA: Diagnosis not present

## 2015-10-18 ENCOUNTER — Ambulatory Visit
Admission: RE | Admit: 2015-10-18 | Discharge: 2015-10-18 | Disposition: A | Discharge status: Home / Self Care | Payer: Medicare Other | Source: Ambulatory Visit | Attending: Radiation Oncology | Admitting: Radiation Oncology

## 2015-10-18 ENCOUNTER — Ambulatory Visit: Payer: Medicare Other

## 2015-10-18 DIAGNOSIS — Z510 Encounter for antineoplastic radiation therapy: Secondary | ICD-10-CM | POA: Diagnosis not present

## 2015-10-20 ENCOUNTER — Other Ambulatory Visit: Payer: Self-pay | Admitting: Radiation Oncology

## 2015-11-01 ENCOUNTER — Telehealth: Payer: Self-pay | Admitting: *Deleted

## 2015-11-01 NOTE — Telephone Encounter (Signed)
Called to report that she is having painful urination and please return her call

## 2015-11-21 ENCOUNTER — Encounter: Payer: Self-pay | Admitting: Radiation Oncology

## 2015-11-21 ENCOUNTER — Ambulatory Visit
Admission: RE | Admit: 2015-11-21 | Discharge: 2015-11-21 | Disposition: A | Discharge status: Home / Self Care | Payer: Medicare Other | Source: Ambulatory Visit | Attending: Radiation Oncology | Admitting: Radiation Oncology

## 2015-11-21 VITALS — BP 136/56 | HR 72 | Temp 98.0°F | Wt 113.8 lb

## 2015-11-21 DIAGNOSIS — C679 Malignant neoplasm of bladder, unspecified: Secondary | ICD-10-CM

## 2015-11-21 NOTE — Progress Notes (Signed)
Radiation Oncology Follow up Note  Name: Jody Mcclain   Date:   11/21/2015 MRN:  [id number] DOB: May 01, 1919    This 82 y.o. female presents to the clinic today for follow-up for stage TII 1 bladder cancer status post external beam radiation.  REFERRING PROVIDER: Wyatt Portela, MD  HPI: Patient is a 82 year old female now 1 month out having completed external beam radiation therapy for a T2 1 urothelial carcinoma of the trigone of the bladder. Biopsy showed invasion into muscularis propria. She has completed radiation therapy is doing fairly well. She received oral Xeloda during treatment. She has been having some nocturia 4 and decreased stream during the day. I did put her on.Myrbetriq although according to her daughter she does not take the medication at all. She was having no side effects from it although did not immediately notice improvement in symptoms. She's having no hematuria diarrhea at this time.  COMPLICATIONS OF TREATMENT: none  FOLLOW UP COMPLIANCE: keeps appointments   PHYSICAL EXAM:  BP 136/56 mmHg  Pulse 72  Temp(Src) 98 F (36.7 C)  Wt 113 lb 12.1 oz (51.6 kg) Well-developed well-nourished patient in NAD. HEENT reveals PERLA, EOMI, discs not visualized.  Oral cavity is clear. No oral mucosal lesions are identified. Neck is clear without evidence of cervical or supraclavicular adenopathy. Lungs are clear to A&P. Cardiac examination is essentially unremarkable with regular rate and rhythm without murmur rub or thrill. Abdomen is benign with no organomegaly or masses noted. Motor sensory and DTR levels are equal and symmetric in the upper and lower extremities. Cranial nerves II through XII are grossly intact. Proprioception is intact. No peripheral adenopathy or edema is identified. No motor or sensory levels are noted. Crude visual fields are within normal range.  RADIOLOGY RESULTS: No current films for review.  PLAN: At this time I am putting her back onMyrbetriq .  Daughter assures me she'll make sure she takes it every day and stay on for least 2 weeks to evaluate for response. Otherwise I'm please were overall progress. I've scheduled her to see Dr. Loel Lofty for possible re-cystoscopy in the near future. Otherwise she is doing well I've asked to see her back in 3 months for follow-up. Patient knows to call sooner with any concerns.  I would like to take this opportunity for allowing me to participate in the care of your patient.Armstead Peaks., MD

## 2015-11-30 IMAGING — US US RENAL
2 series · 14 of 25 positions shown · non-contrast
Comparison: CT abdomen of 10/30/2010

CLINICAL DATA: Chronic kidney disease, stage IV

EXAM:
RENAL / URINARY TRACT ULTRASOUND COMPLETE

[Series 1: us renal · 0.17mm/px · 13 of 105 slices shown (1 of 2)]
[im 1/105]
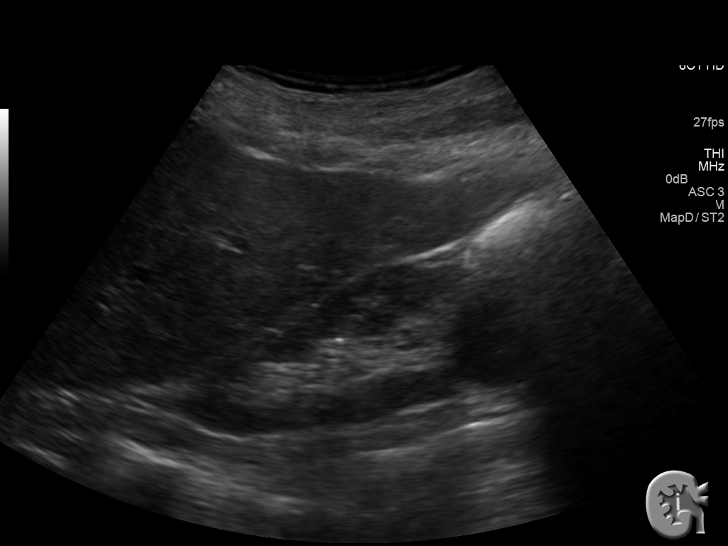
[im 10/105]
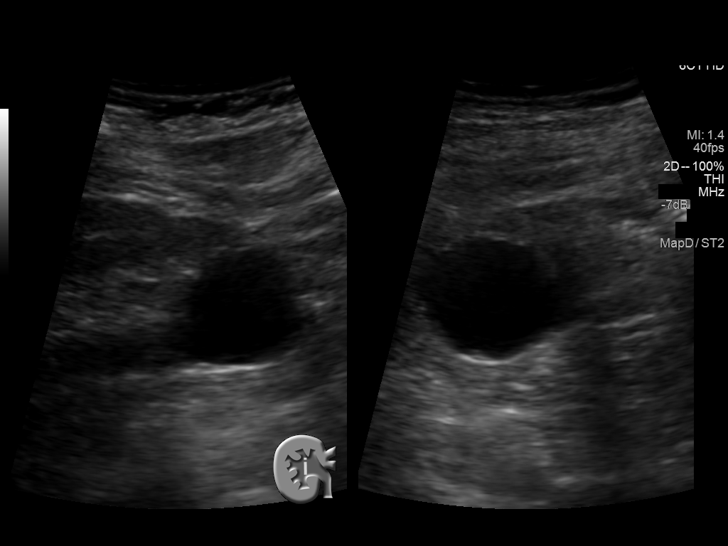
[im 19/105]
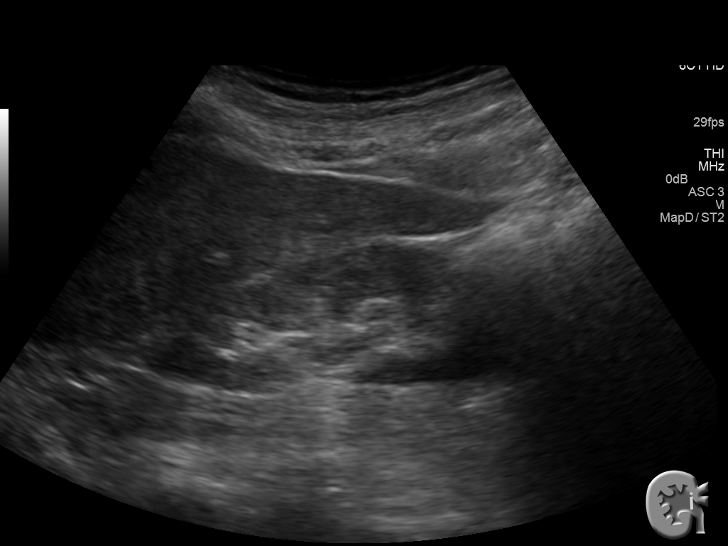
[im 28/105]
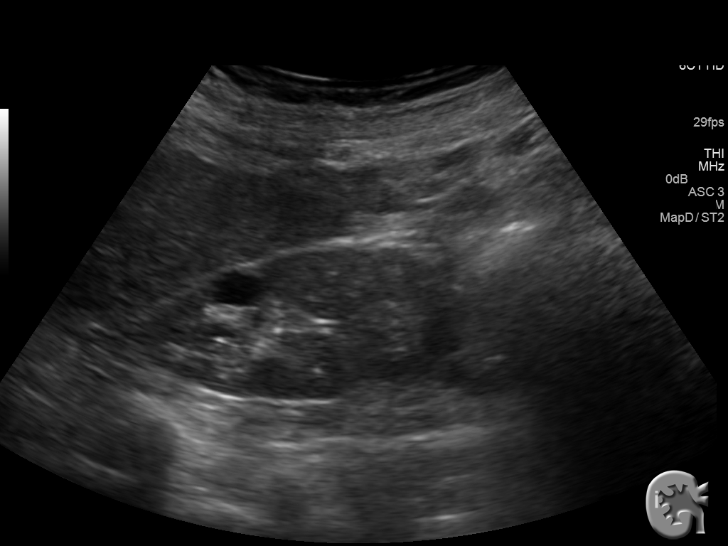
[im 37/105]
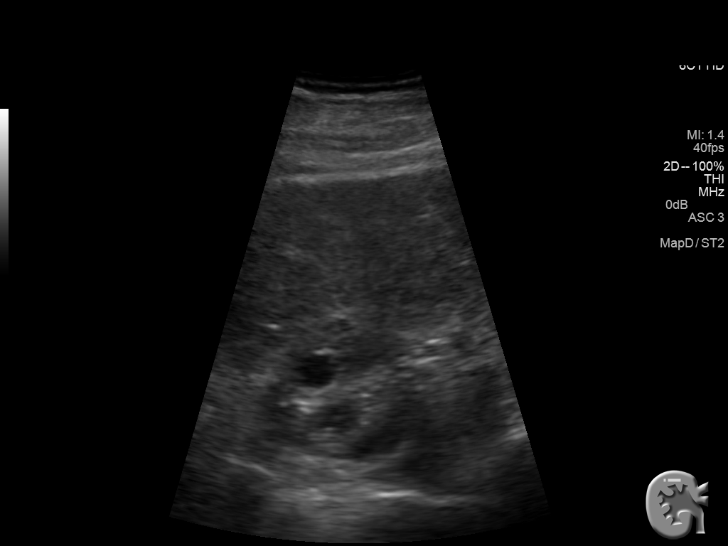
[im 41/105]
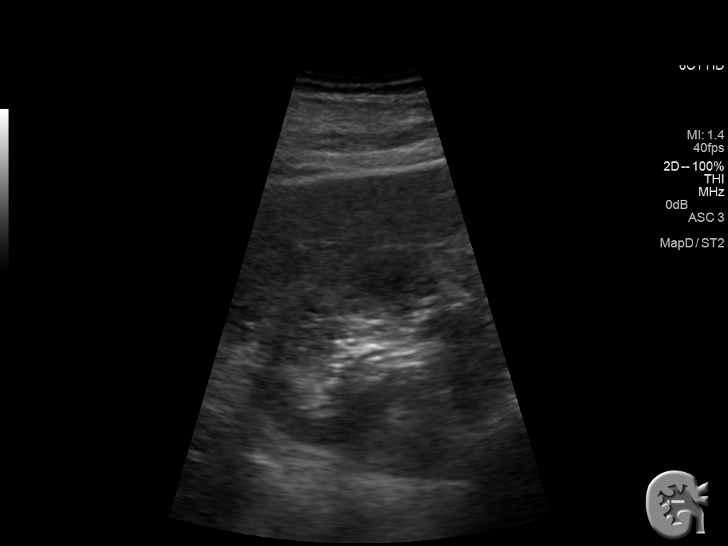
[im 50/105]
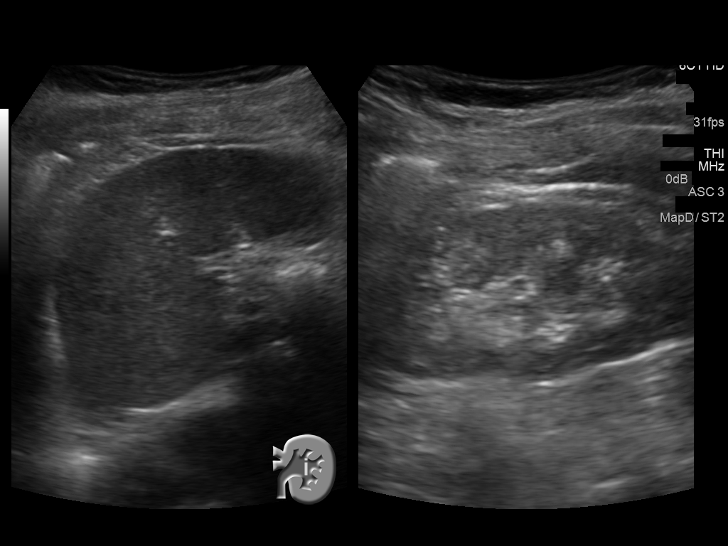
[im 59/105]
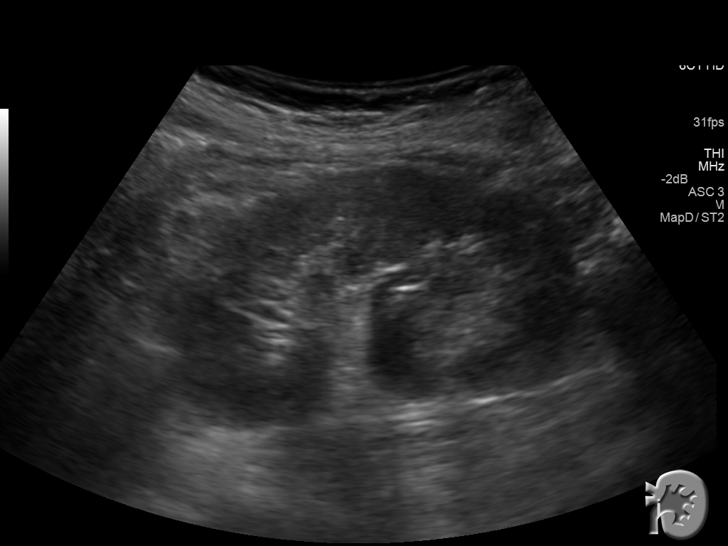
[im 68/105]
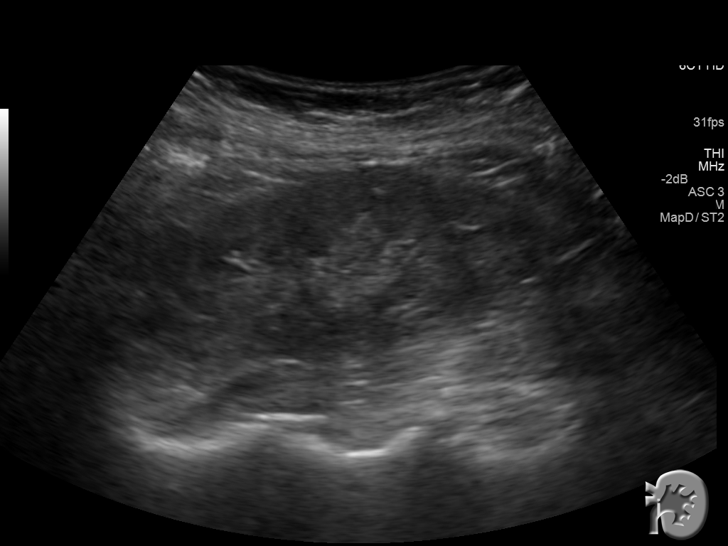
[im 73/105]
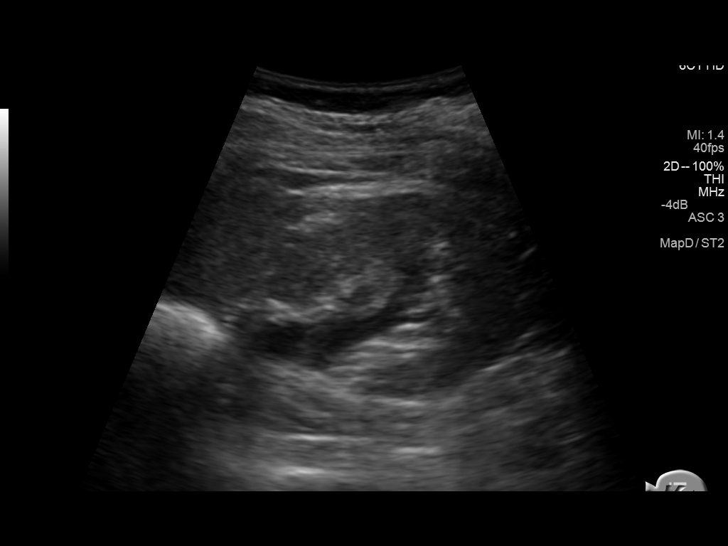
[im 82/105]
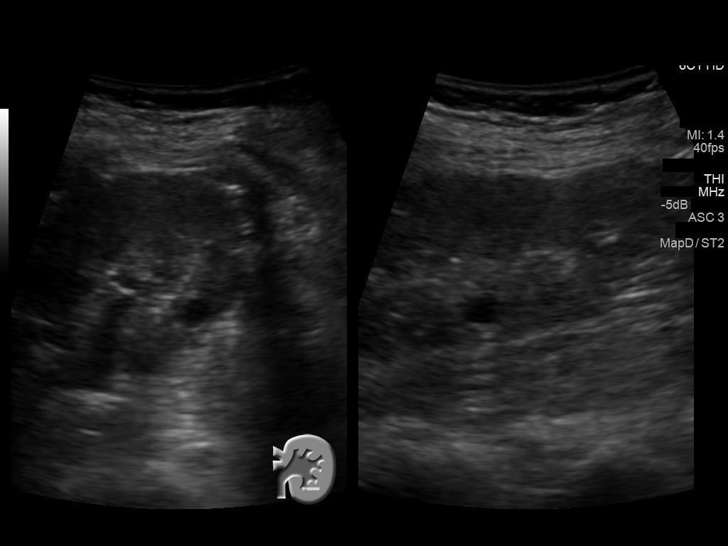
[im 91/105]
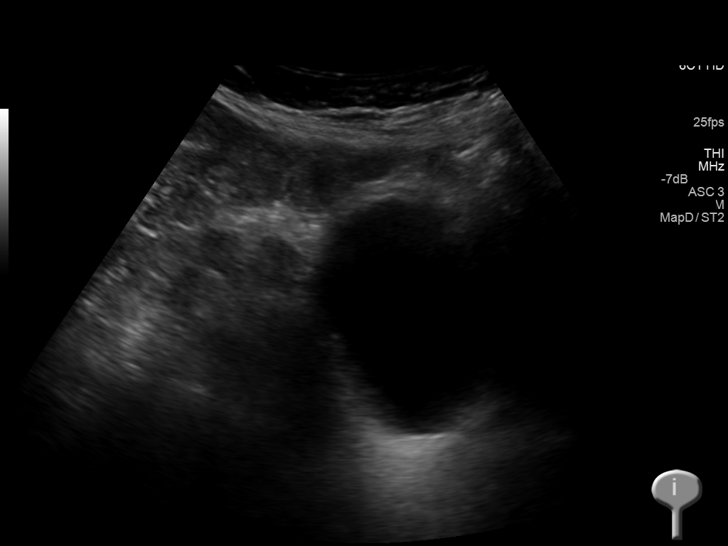
[im 100/105]
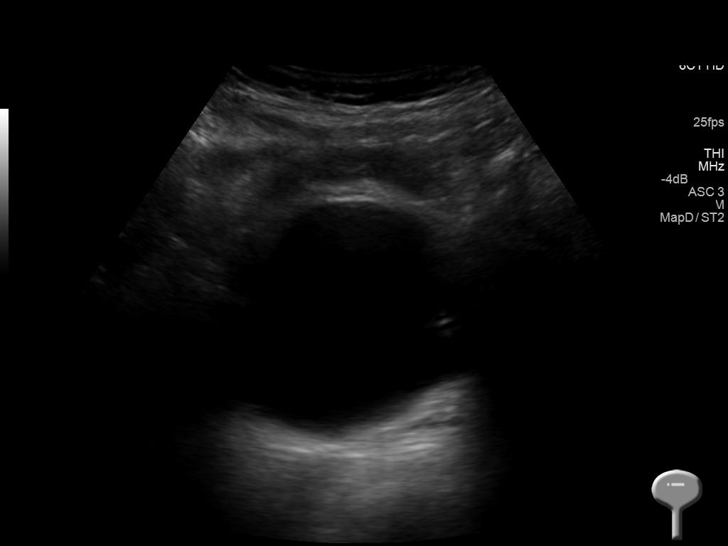

[Series 3: us renal · 0.18mm/px · 1 of 2 slices shown (2 of 2)]
[im 1/2]
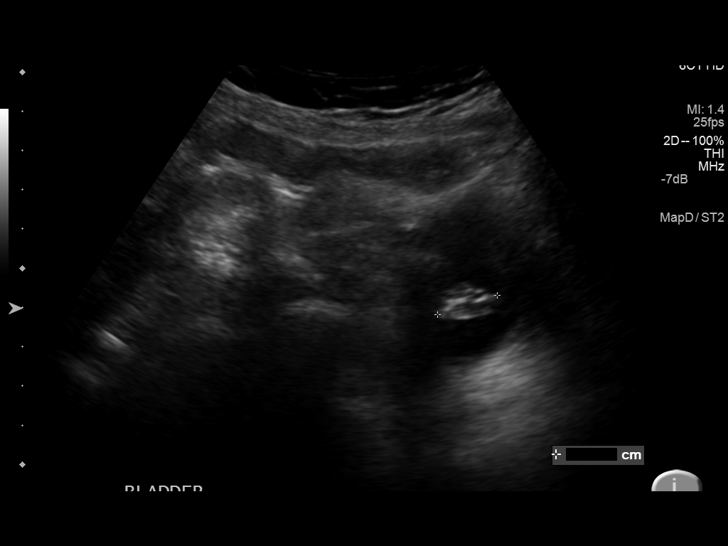

[14 of 25 positions shown; findings below may reference images not displayed]

FINDINGS: Right Kidney:

Length: 9.1 cm.. No hydronephrosis is seen. The echogenicity of the
right kidney is increased consistent with chronic renal medical
disease. Right renal cysts are present, the largest in the lower
pole of 2.5 cm.

Left Kidney:

Length: 8.2 cm.. No hydronephrosis is seen the parenchyma is
echogenic consistent with chronic renal medical disease. A lower
pole cyst measures only 8 mm in diameter.

Bladder:

The urinary bladder is not well distended. However there is an
echogenic focus along the left lateral urinary bladder wall of
cm, which does not appear to move. A urinary bladder carcinoma
cannot be excluded.
IMPRESSION: 1. No hydronephrosis.
2. Echogenic renal parenchyma consistent with chronic renal medical
disease.
3. 1.6 cm echogenic focus within the left lateral urinary bladder
may represent bladder carcinoma. Correlate clinically.

## 2016-03-05 ENCOUNTER — Ambulatory Visit: Payer: Medicare Other | Attending: Radiation Oncology | Admitting: Radiation Oncology

## 2016-03-26 ENCOUNTER — Encounter: Payer: Self-pay | Admitting: *Deleted

## 2016-09-29 ENCOUNTER — Encounter: Payer: Self-pay | Admitting: *Deleted

## 2016-09-29 ENCOUNTER — Other Ambulatory Visit: Payer: Self-pay | Admitting: Physician Assistant

## 2016-09-29 ENCOUNTER — Inpatient Hospital Stay
Admission: EM | Admit: 2016-09-29 | Discharge: 2016-10-02 | DRG: 482 | Disposition: A | Discharge status: Home Health | Payer: Medicare Other | Attending: Specialist | Admitting: Specialist

## 2016-09-29 ENCOUNTER — Emergency Department: Payer: Medicare Other

## 2016-09-29 ENCOUNTER — Ambulatory Visit
Admission: RE | Admit: 2016-09-29 | Discharge: 2016-09-29 | Disposition: A | Discharge status: Home / Self Care | Payer: Medicare Other | Source: Ambulatory Visit | Attending: Physician Assistant | Admitting: Physician Assistant

## 2016-09-29 DIAGNOSIS — Z90710 Acquired absence of both cervix and uterus: Secondary | ICD-10-CM | POA: Diagnosis not present

## 2016-09-29 DIAGNOSIS — K219 Gastro-esophageal reflux disease without esophagitis: Secondary | ICD-10-CM | POA: Diagnosis present

## 2016-09-29 DIAGNOSIS — M1990 Unspecified osteoarthritis, unspecified site: Secondary | ICD-10-CM | POA: Diagnosis present

## 2016-09-29 DIAGNOSIS — Z7982 Long term (current) use of aspirin: Secondary | ICD-10-CM | POA: Diagnosis not present

## 2016-09-29 DIAGNOSIS — N393 Stress incontinence (female) (male): Secondary | ICD-10-CM | POA: Diagnosis present

## 2016-09-29 DIAGNOSIS — I129 Hypertensive chronic kidney disease with stage 1 through stage 4 chronic kidney disease, or unspecified chronic kidney disease: Secondary | ICD-10-CM | POA: Diagnosis present

## 2016-09-29 DIAGNOSIS — E039 Hypothyroidism, unspecified: Secondary | ICD-10-CM | POA: Diagnosis present

## 2016-09-29 DIAGNOSIS — Z79899 Other long term (current) drug therapy: Secondary | ICD-10-CM | POA: Diagnosis not present

## 2016-09-29 DIAGNOSIS — W1830XA Fall on same level, unspecified, initial encounter: Secondary | ICD-10-CM | POA: Diagnosis present

## 2016-09-29 DIAGNOSIS — Z87891 Personal history of nicotine dependence: Secondary | ICD-10-CM | POA: Diagnosis not present

## 2016-09-29 DIAGNOSIS — M25451 Effusion, right hip: Secondary | ICD-10-CM | POA: Insufficient documentation

## 2016-09-29 DIAGNOSIS — S72011A Unspecified intracapsular fracture of right femur, initial encounter for closed fracture: Secondary | ICD-10-CM

## 2016-09-29 DIAGNOSIS — R262 Difficulty in walking, not elsewhere classified: Secondary | ICD-10-CM

## 2016-09-29 DIAGNOSIS — Z8551 Personal history of malignant neoplasm of bladder: Secondary | ICD-10-CM | POA: Diagnosis not present

## 2016-09-29 DIAGNOSIS — M25551 Pain in right hip: Secondary | ICD-10-CM

## 2016-09-29 DIAGNOSIS — E785 Hyperlipidemia, unspecified: Secondary | ICD-10-CM | POA: Diagnosis present

## 2016-09-29 DIAGNOSIS — N183 Chronic kidney disease, stage 3 (moderate): Secondary | ICD-10-CM | POA: Diagnosis present

## 2016-09-29 DIAGNOSIS — M6281 Muscle weakness (generalized): Secondary | ICD-10-CM

## 2016-09-29 DIAGNOSIS — X58XXXA Exposure to other specified factors, initial encounter: Secondary | ICD-10-CM | POA: Insufficient documentation

## 2016-09-29 DIAGNOSIS — Z8781 Personal history of (healed) traumatic fracture: Secondary | ICD-10-CM

## 2016-09-29 DIAGNOSIS — S72001A Fracture of unspecified part of neck of right femur, initial encounter for closed fracture: Secondary | ICD-10-CM

## 2016-09-29 LAB — CBC WITH DIFFERENTIAL/PLATELET
BASOS ABS: 0 10*3/uL (ref 0–0.1)
Basophils Relative: 1 %
EOS PCT: 1 %
Eosinophils Absolute: 0 10*3/uL (ref 0–0.7)
HEMATOCRIT: 41.1 % (ref 35.0–47.0)
Hemoglobin: 14.2 g/dL (ref 12.0–16.0)
Lymphocytes Relative: 15 %
Lymphs Abs: 0.6 10*3/uL — ABNORMAL LOW (ref 1.0–3.6)
MCH: 30.8 pg (ref 26.0–34.0)
MCHC: 34.5 g/dL (ref 32.0–36.0)
MCV: 89.1 fL (ref 80.0–100.0)
MONO ABS: 0.3 10*3/uL (ref 0.2–0.9)
Monocytes Relative: 8 %
NEUTROS ABS: 3.1 10*3/uL (ref 1.4–6.5)
Neutrophils Relative %: 75 %
PLATELETS: 228 10*3/uL (ref 150–440)
RBC: 4.62 MIL/uL (ref 3.80–5.20)
RDW: 13.5 % (ref 11.5–14.5)
WBC: 4 10*3/uL (ref 3.6–11.0)

## 2016-09-29 LAB — PROTIME-INR
INR: 1.02
Prothrombin Time: 13.4 seconds (ref 11.4–15.2)

## 2016-09-29 LAB — BASIC METABOLIC PANEL
ANION GAP: 9 (ref 5–15)
BUN: 24 mg/dL — ABNORMAL HIGH (ref 6–20)
CALCIUM: 9.4 mg/dL (ref 8.9–10.3)
CO2: 26 mmol/L (ref 22–32)
Chloride: 100 mmol/L — ABNORMAL LOW (ref 101–111)
Creatinine, Ser: 1.25 mg/dL — ABNORMAL HIGH (ref 0.44–1.00)
GFR, EST AFRICAN AMERICAN: 41 mL/min — AB (ref >60)
GFR, EST NON AFRICAN AMERICAN: 35 mL/min — AB (ref >60)
Glucose, Bld: 103 mg/dL — ABNORMAL HIGH (ref 65–99)
Potassium: 4.1 mmol/L (ref 3.5–5.1)
Sodium: 135 mmol/L (ref 135–145)

## 2016-09-29 MED ORDER — ONDANSETRON HCL 4 MG/2ML IJ SOLN: 4.0000 mg | Freq: Four times a day (QID) | INTRAMUSCULAR | Status: DC | PRN

## 2016-09-29 MED ORDER — POLYETHYLENE GLYCOL 3350 17 G PO PACK: 17.0000 g | PACK | Freq: Every day | ORAL | Status: DC | PRN

## 2016-09-29 MED ORDER — ACETAMINOPHEN 650 MG RE SUPP: 650.0000 mg | Freq: Four times a day (QID) | RECTAL | Status: DC | PRN

## 2016-09-29 MED ORDER — MORPHINE SULFATE (PF) 4 MG/ML IV SOLN
2.0000 mg | INTRAVENOUS | Status: DC | PRN
  Administered 2016-09-30: 2 mg via INTRAVENOUS

## 2016-09-29 MED ORDER — DOCUSATE SODIUM 100 MG PO CAPS
100.0000 mg | ORAL_CAPSULE | Freq: Two times a day (BID) | ORAL | Status: DC
  Administered 2016-09-29 – 2016-09-30 (×2): 100 mg via ORAL
  Filled 2016-09-29 (×2): qty 1

## 2016-09-29 MED ORDER — PANTOPRAZOLE SODIUM 40 MG PO TBEC
40.0000 mg | DELAYED_RELEASE_TABLET | Freq: Every day | ORAL | Status: DC
  Administered 2016-09-29 – 2016-10-02 (×3): 40 mg via ORAL
  Filled 2016-09-29 (×3): qty 1

## 2016-09-29 MED ORDER — EZETIMIBE 10 MG PO TABS
10.0000 mg | ORAL_TABLET | Freq: Every day | ORAL | Status: DC
  Administered 2016-09-29 – 2016-10-02 (×4): 10 mg via ORAL
  Filled 2016-09-29 (×4): qty 1

## 2016-09-29 MED ORDER — ONDANSETRON HCL 4 MG PO TABS: 4.0000 mg | ORAL_TABLET | Freq: Four times a day (QID) | ORAL | Status: DC | PRN

## 2016-09-29 MED ORDER — DIAZEPAM 5 MG PO TABS: 5.0000 mg | ORAL_TABLET | Freq: Four times a day (QID) | ORAL | Status: DC | PRN

## 2016-09-29 MED ORDER — MORPHINE SULFATE (PF) 4 MG/ML IV SOLN
2.0000 mg | Freq: Once | INTRAVENOUS | Status: DC
  Filled 2016-09-29: qty 1

## 2016-09-29 MED ORDER — ASPIRIN EC 81 MG PO TBEC
81.0000 mg | DELAYED_RELEASE_TABLET | Freq: Every day | ORAL | Status: DC
  Administered 2016-09-30 – 2016-10-02 (×3): 81 mg via ORAL
  Filled 2016-09-29 (×3): qty 1

## 2016-09-29 MED ORDER — PANTOPRAZOLE SODIUM 40 MG PO TBEC: 40.0000 mg | DELAYED_RELEASE_TABLET | Freq: Every day | ORAL | Status: DC

## 2016-09-29 MED ORDER — FERROUS SULFATE 325 (65 FE) MG PO TABS
325.0000 mg | ORAL_TABLET | Freq: Every day | ORAL | Status: DC
  Administered 2016-09-30 – 2016-10-02 (×3): 325 mg via ORAL
  Filled 2016-09-29 (×3): qty 1

## 2016-09-29 MED ORDER — LEVOTHYROXINE SODIUM 50 MCG PO TABS
50.0000 ug | ORAL_TABLET | Freq: Every day | ORAL | Status: DC
  Administered 2016-09-30 – 2016-10-02 (×3): 50 ug via ORAL
  Filled 2016-09-29 (×3): qty 1

## 2016-09-29 MED ORDER — MORPHINE SULFATE (PF) 2 MG/ML IV SOLN
INTRAVENOUS | Status: AC
  Administered 2016-09-29: 2 mg via INTRAVENOUS
  Filled 2016-09-29: qty 1

## 2016-09-29 MED ORDER — PRAVASTATIN SODIUM 40 MG PO TABS
40.0000 mg | ORAL_TABLET | Freq: Every day | ORAL | Status: DC
  Administered 2016-10-01: 40 mg via ORAL
  Filled 2016-09-29: qty 1

## 2016-09-29 MED ORDER — AMLODIPINE BESYLATE 10 MG PO TABS
10.0000 mg | ORAL_TABLET | Freq: Every morning | ORAL | Status: DC
  Administered 2016-09-30 – 2016-10-02 (×3): 10 mg via ORAL
  Filled 2016-09-29 (×3): qty 1

## 2016-09-29 MED ORDER — METOPROLOL TARTRATE 25 MG PO TABS
12.5000 mg | ORAL_TABLET | Freq: Every morning | ORAL | Status: DC
Start: 2016-09-30 — End: 2016-10-02
  Administered 2016-10-01: 12.5 mg via ORAL
  Filled 2016-09-29 (×3): qty 1

## 2016-09-29 MED ORDER — ACETAMINOPHEN 325 MG PO TABS: 650.0000 mg | ORAL_TABLET | Freq: Four times a day (QID) | ORAL | Status: DC | PRN

## 2016-09-29 MED ORDER — CLINDAMYCIN PHOSPHATE 600 MG/50ML IV SOLN
600.0000 mg | INTRAVENOUS | Status: AC
  Filled 2016-09-29: qty 50

## 2016-09-29 MED ORDER — MIRABEGRON ER 25 MG PO TB24
25.0000 mg | ORAL_TABLET | Freq: Every day | ORAL | Status: DC
  Administered 2016-09-30 – 2016-10-01 (×2): 25 mg via ORAL
  Filled 2016-09-29 (×3): qty 1

## 2016-09-29 MED ORDER — OXYCODONE HCL 5 MG PO TABS
5.0000 mg | ORAL_TABLET | ORAL | Status: DC | PRN
  Administered 2016-09-29 – 2016-09-30 (×3): 5 mg via ORAL
  Filled 2016-09-29 (×3): qty 1

## 2016-09-29 NOTE — Progress Notes (Signed)
Pt is refusing Foley catheter at this time. Pt states she has a history of bladder cancer and it is very painful for Foley to be placed. Pt prefers it to be inserted at the time of surgery.

## 2016-09-29 NOTE — ED Notes (Signed)
ED Provider at bedside. 

## 2016-09-29 NOTE — ED Triage Notes (Signed)
Pt to triage via wheelchair.  Pt reports pain in right hip.  Pt fell 17 days ago and today had outpt ct scan of hip. Pt sent to er for eval.  Pt alert.

## 2016-09-29 NOTE — ED Provider Notes (Signed)
[facility] Emergency Department Provider Note  ____________________________________________   I have reviewed the triage vital signs and the nursing notes.   HISTORY  Chief Complaint Fall and Hip Pain   History limited by: Not Limited   HPI Jody Mcclain is a 82 y.o. female who presents to the emergency department today because of right hip fracture found on outpatient CT scan. The patient states that almost 3 weeks ago she hurt her right hip. She was running and thinks that she might have twisted that leg. She noticed significant amount of pain until slightly later time. Since then she has noticed pain in her hip and has had a hard time walking. She has tried using a walker but still is not really able to bear much weight on that leg. Denies any other injuries.   Past Medical History:  Diagnosis Date  . Arthritis    back and joints  . 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 Chronic constipation   . CKD (chronic kidney disease), stage III    pt has been referred to nephrologist by PCP, appt. is later this month July 2016  . Full dentures   . Generalized weakness    bilateral legs  . GERD (gastroesophageal reflux disease)   . Hematuria   . History of hiatal hernia   . Hyperlipidemia   . Hypertension   . Hypothyroidism   . Lower extremity edema   . Malaise and fatigue   . OAB (overactive bladder)   . Renal neoplasm   . SUI (stress urinary incontinence, female)   . Tricuspid valve regurgitation, nonrheumatic cardiologist-- dr Saralyn Pilar   severe per cardiology note    There are no active problems to display for this patient.   Past Surgical History:  Procedure Laterality Date  . CATARACT EXTRACTION W/ INTRAOCULAR LENS  IMPLANT, BILATERAL  2001  . CYSTOSCOPY WITH BIOPSY N/A 07/06/2015   Procedure: CYSTOSCOPY WITH BLADDER BIOPSY;  Surgeon: Kathie Rhodes, MD;  Location: [facility];  Service: Urology;  Laterality: N/A;  . TRANSTHORACIC ECHOCARDIOGRAM  04-06-2013   dr alexander paraschos   mild to moderate LVH,  ef 60%/  moderate AI , valve area 2.6cm^2/  moderate to severe TI/  mild MI  . TRANSURETHRAL RESECTION OF BLADDER TUMOR WITH GYRUS (TURBT-GYRUS) N/A 05/18/2015   Procedure: TRANSURETHRAL RESECTION OF BLADDER TUMOR WITH GYRUS (TURBT-GYRUS);  Surgeon: Kathie Rhodes, MD;  Location: [facility];  Service: Urology;  Laterality: N/A;  . [place]    Prior to Admission medications   Medication Sig Start Date End Date Taking? Authorizing Provider  amLODipine (NORVASC) 10 MG tablet Take 10 mg by mouth every morning.     Historical Provider, MD  aspirin EC 81 MG tablet Take 81 mg by mouth daily.    Historical Provider, MD  Bisacodyl (CORRECTIVE LAXATIVE PO) Take by mouth as needed.    Historical Provider, MD  Calcium Carb-Cholecalciferol (CALCIUM 600 + D) 600-200 MG-UNIT TABS Take 1 tablet by mouth daily.     Historical Provider, MD  capecitabine (XELODA) 500 MG tablet Take one tablet daily by mouth, Monday to Friday with radiation only. 10/08/15   Wyatt Portela, MD  ciprofloxacin (CIPRO) 500 MG tablet Take 1 tablet (500 mg total) by mouth 2 (two) times daily. 09/24/15   Noreene Filbert, MD  Cyanocobalamin (B-12) 1000 MCG/ML KIT  Inject as directed every 30 (thirty) days.    Historical Provider, MD  diazepam (VALIUM) 10 MG tablet  05/18/15   Historical Provider, MD  ferrous sulfate 325 (65 FE) MG tablet TAKE 1 TABLET (325 MG TOTAL) BY MOUTH DAILY WITH BREAKFAST. 06/07/15   Historical Provider, MD  HYDROcodone-acetaminophen (NORCO) 10-325 MG per tablet Take 1-2 tablets by mouth every 4 (four) hours as needed for moderate pain. Maximum dose per 24 hours - 8 pills 07/06/15   Kathie Rhodes, MD  HYDROcodone-acetaminophen (NORCO/VICODIN) 5-325 MG per tablet Take 1 tablet by mouth every 6 (six) hours as needed  for moderate pain. 05/18/15   Kathie Rhodes, MD  levothyroxine (SYNTHROID, LEVOTHROID) 50 MCG tablet Take 50 mcg by mouth daily before breakfast.    Historical Provider, MD  lovastatin (MEVACOR) 40 MG tablet Take 40 mg by mouth every morning.     Historical Provider, MD  metoprolol tartrate (LOPRESSOR) 25 MG tablet Take 12.5 mg by mouth every morning. CAN TAKE ANOTHER 1/2TAB IF FEELS HEART RACING    Historical Provider, MD  MYRBETRIQ 25 MG TB24 tablet TAKE 1 TABLET BY MOUTH EVERY DAY 10/22/15   Noreene Filbert, MD  omeprazole (PRILOSEC) 40 MG capsule Take 40 mg by mouth every morning.     Historical Provider, MD  phenazopyridine (PYRIDIUM) 200 MG tablet Take 1 tablet (200 mg total) by mouth 3 (three) times daily as needed for pain. 05/18/15   Kathie Rhodes, MD  phenazopyridine (PYRIDIUM) 200 MG tablet Take 1 tablet (200 mg total) by mouth 3 (three) times daily as needed for pain. 07/06/15   Kathie Rhodes, MD  phenazopyridine (PYRIDIUM) 200 MG tablet Take 1 tablet (200 mg total) by mouth 3 (three) times daily as needed for pain. 09/14/15   Dorisann Frames, MD  promethazine (PHENERGAN) 25 MG tablet Take 1 tablet (25 mg total) by mouth every 6 (six) hours as needed for nausea or vomiting. 09/10/15   Noreene Filbert, MD  ZETIA 10 MG tablet Take 10 mg by mouth daily. 06/23/15   Historical Provider, MD    Allergies Augmentin [amoxicillin-pot clavulanate]; Boniva [ibandronic acid]; Ceftin [cefuroxime]; Fosamax [alendronate]; Lipitor [atorvastatin]; Sulfa antibiotics; and Thiazide-type diuretics  No family history on file.  Social History Social History  Substance Use Topics  . Smoking status: Former Smoker    Packs/day: 2.00    Years: 20.00    Types: Cigarettes    Quit date: 05/15/1968  . Smokeless tobacco: Never Used  . Alcohol use No    Review of Systems  Constitutional: Negative for fever. Cardiovascular: Negative for chest pain. Respiratory: Negative for shortness of breath. Gastrointestinal:  Negative for abdominal pain, vomiting and diarrhea. Genitourinary: Negative for dysuria. Musculoskeletal: Negative for back pain. Positive for right hip pain. Skin: Negative for rash. Neurological: Negative for headaches, focal weakness or numbness.  10-point ROS otherwise negative.  ____________________________________________   PHYSICAL EXAM:  VITAL SIGNS: ED Triage Vitals  Enc Vitals Group     BP 09/29/16 1810 (!) 163/67     Pulse Rate 09/29/16 1810 67     Resp 09/29/16 1810 18     Temp 09/29/16 1810 98.2 F (36.8 C)     Temp Source 09/29/16 1810 Oral     SpO2 09/29/16 1810 99 %     Weight 09/29/16 1812 117 lb (53.1 kg)     Height 09/29/16 1812 5' (1.524 m)     Head Circumference --      Peak Flow --  Pain Score 09/29/16 1817 0   Constitutional: Alert and oriented. Well appearing and in no distress. Eyes: Conjunctivae are normal. Normal extraocular movements. ENT   Head: Normocephalic and atraumatic.   Nose: No congestion/rhinnorhea.   Mouth/Throat: Mucous membranes are moist.   Neck: No stridor. Hematological/Lymphatic/Immunilogical: No cervical lymphadenopathy. Cardiovascular: Normal rate, regular rhythm.  No murmurs, rubs, or gallops. Respiratory: Normal respiratory effort without tachypnea nor retractions. Breath sounds are clear and equal bilaterally. No wheezes/rales/rhonchi. Gastrointestinal: Soft and nontender. No distention.  Genitourinary: Deferred Musculoskeletal: Right hip tenderness to manipulation and palpation. Neurologic:  Normal speech and language. No gross focal neurologic deficits are appreciated.  Skin:  Skin is warm, dry and intact. No rash noted. Psychiatric: Mood and affect are normal. Speech and behavior are normal. Patient exhibits appropriate insight and judgment.  ____________________________________________    LABS (pertinent positives/negatives)  Labs Reviewed  CBC WITH DIFFERENTIAL/PLATELET - Abnormal; Notable for  the following:       Result Value   Lymphs Abs 0.6 (*)    All other components within normal limits  BASIC METABOLIC PANEL - Abnormal; Notable for the following:    Chloride 100 (*)    Glucose, Bld 103 (*)    BUN 24 (*)    Creatinine, Ser 1.25 (*)    GFR calc non Af Amer 35 (*)    GFR calc Af Amer 41 (*)    All other components within normal limits  SURGICAL PCR SCREEN  PROTIME-INR    ____________________________________________   EKG  I, Nance Pear, attending physician, personally viewed and interpreted this EKG  EKG Time: 1847 Rate: 71 Rhythm: normal sinus rhythm Axis: normal Intervals: qtc 443 QRS: narrow, q waves V1 ST changes: no st elevation, t wave inversion V1, V2 Impression: abnormal ekg   ____________________________________________    RADIOLOGY  CXR IMPRESSION:  1. Emphysema.  2. Mild cardiomegaly.  3. Small to moderate hiatal hernia.  4. Bony demineralization.  5. Old healed right upper lateral rib fractures.   ____________________________________________   PROCEDURES  Procedures  ____________________________________________   INITIAL IMPRESSION / ASSESSMENT AND PLAN / ED COURSE  Pertinent labs & imaging results that were available during my care of the patient were reviewed by me and considered in my medical decision making (see chart for details).  Patient presented from her note a clinic today because of right hip fracture found on CT scan. Patient will be admitted to the hospital service. I have discussed with Dr. Bess Harvest with orthopedics. ____________________________________________   FINAL CLINICAL IMPRESSION(S) / ED DIAGNOSES  Final diagnoses:  Closed fracture of right hip, initial encounter [facility])     Note: This dictation was prepared with Dragon dictation. Any transcriptional errors that result from this process are unintentional    Nance Pear, MD 09/29/16 [phone]

## 2016-09-29 NOTE — ED Notes (Signed)
Pt wants to hold off on more pain meds at this time, will notified MD

## 2016-09-29 NOTE — H&P (Signed)
[facility] [place] NAME: Jody Mcclain    MR#:  [id number]  DATE OF BIRTH:  September 18, 1919   DATE OF ADMISSION:  09/29/2016  PRIMARY CARE PHYSICIAN: Glendon Axe, MD   REQUESTING/REFERRING PHYSICIAN: Archie Balboa  CHIEF COMPLAINT:   Chief Complaint  Patient presents with  . Fall  . Hip Pain    HISTORY OF PRESENT ILLNESS:  Jody Mcclain  is a 82 y.o. female with a known history of Bladder cancer, who is presenting after mechanical fall. Patient suffered a mechanical fall about 2 weeks ago who felt immediate pain and right hip worse with movements no relief. She initially described this as only "pain" 9/10, nonradiating. She was initially able to ambulate with a walker-with some pain however, after worsening symptoms sought Medical help found to have fracture on CAT scan  PAST MEDICAL HISTORY:   Past Medical History:  Diagnosis Date  . Arthritis    back and joints  . 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 Chronic constipation   . CKD (chronic kidney disease), stage III    pt has been referred to nephrologist by PCP, appt. is later this month July 2016  . Full dentures   . Generalized weakness    bilateral legs  . GERD (gastroesophageal reflux disease)   . Hematuria   . History of hiatal hernia   . Hyperlipidemia   . Hypertension   . Hypothyroidism   . Lower extremity edema   . Malaise and fatigue   . OAB (overactive bladder)   . Renal neoplasm   . SUI (stress urinary incontinence, female)   . Tricuspid valve regurgitation, nonrheumatic cardiologist-- dr Saralyn Pilar   severe per cardiology note    PAST SURGICAL HISTORY:   Past Surgical History:  Procedure Laterality Date  . CATARACT EXTRACTION W/ INTRAOCULAR LENS  IMPLANT, BILATERAL  2001  . CYSTOSCOPY WITH BIOPSY N/A 07/06/2015   Procedure: CYSTOSCOPY WITH BLADDER BIOPSY;   Surgeon: Kathie Rhodes, MD;  Location: [facility];  Service: Urology;  Laterality: N/A;  . TRANSTHORACIC ECHOCARDIOGRAM  04-06-2013   dr alexander paraschos   mild to moderate LVH,  ef 60%/  moderate AI , valve area 2.6cm^2/  moderate to severe TI/  mild MI  . TRANSURETHRAL RESECTION OF BLADDER TUMOR WITH GYRUS (TURBT-GYRUS) N/A 05/18/2015   Procedure: TRANSURETHRAL RESECTION OF BLADDER TUMOR WITH GYRUS (TURBT-GYRUS);  Surgeon: Kathie Rhodes, MD;  Location: [facility];  Service: Urology;  Laterality: N/A;  . VAGINAL HYSTERECTOMY  1969    SOCIAL HISTORY:   Social History  Substance Use Topics  . Smoking status: Former Smoker    Packs/day: 2.00    Years: 20.00    Types: Cigarettes    Quit date: 05/15/1968  . Smokeless tobacco: Never Used  . Alcohol use No    FAMILY HISTORY:   Family History  Problem Relation Age of Onset  . Hypertension Other     DRUG ALLERGIES:   Allergies  Allergen Reactions  . Augmentin [Amoxicillin-Pot Clavulanate] Nausea Only  . Boniva [Ibandronic Acid] Other (See Comments)    unknown  . Ceftin [Cefuroxime] Other (See Comments)  . Fosamax [Alendronate] Other (See Comments)    unknown  . Lipitor [Atorvastatin] Other (See Comments)    MUSCLE PAIN  . Sulfa Antibiotics Nausea Only  . Thiazide-Type Diuretics Other (See Comments)    unknown  REVIEW OF SYSTEMS:  REVIEW OF SYSTEMS:  CONSTITUTIONAL: Denies fevers, chills, fatigue, weakness.  EYES: Denies blurred vision, double vision, or eye pain.  EARS, NOSE, THROAT: Denies tinnitus, ear pain, hearing loss.  RESPIRATORY: denies cough, shortness of breath, wheezing  CARDIOVASCULAR: Denies chest pain, palpitations, edema.  GASTROINTESTINAL: Denies nausea, vomiting, diarrhea, abdominal pain.  GENITOURINARY: Denies dysuria, hematuria.  ENDOCRINE: Denies nocturia or thyroid problems. HEMATOLOGIC AND LYMPHATIC: Denies easy bruising or bleeding.  SKIN: Denies rash or lesions.    MUSCULOSKELETAL: Positive right hip pain otherwise Denies pain in neck, back, shoulder, knees, , or further arthritic symptoms.  NEUROLOGIC: Denies paralysis, paresthesias.  PSYCHIATRIC: Denies anxiety or depressive symptoms. Otherwise full review of systems performed by me is negative.   MEDICATIONS AT HOME:   Prior to Admission medications   Medication Sig Start Date End Date Taking? Authorizing Provider  amLODipine (NORVASC) 10 MG tablet Take 10 mg by mouth every morning.     Historical Provider, MD  aspirin EC 81 MG tablet Take 81 mg by mouth daily.    Historical Provider, MD  Bisacodyl (CORRECTIVE LAXATIVE PO) Take by mouth as needed.    Historical Provider, MD  Calcium Carb-Cholecalciferol (CALCIUM 600 + D) 600-200 MG-UNIT TABS Take 1 tablet by mouth daily.     Historical Provider, MD  capecitabine (XELODA) 500 MG tablet Take one tablet daily by mouth, Monday to Friday with radiation only. 10/08/15   Wyatt Portela, MD  ciprofloxacin (CIPRO) 500 MG tablet Take 1 tablet (500 mg total) by mouth 2 (two) times daily. 09/24/15   Noreene Filbert, MD  Cyanocobalamin (B-12) 1000 MCG/ML KIT Inject as directed every 30 (thirty) days.    Historical Provider, MD  diazepam (VALIUM) 10 MG tablet  05/18/15   Historical Provider, MD  ferrous sulfate 325 (65 FE) MG tablet TAKE 1 TABLET (325 MG TOTAL) BY MOUTH DAILY WITH BREAKFAST. 06/07/15   Historical Provider, MD  HYDROcodone-acetaminophen (NORCO) 10-325 MG per tablet Take 1-2 tablets by mouth every 4 (four) hours as needed for moderate pain. Maximum dose per 24 hours - 8 pills 07/06/15   Kathie Rhodes, MD  HYDROcodone-acetaminophen (NORCO/VICODIN) 5-325 MG per tablet Take 1 tablet by mouth every 6 (six) hours as needed for moderate pain. 05/18/15   Kathie Rhodes, MD  levothyroxine (SYNTHROID, LEVOTHROID) 50 MCG tablet Take 50 mcg by mouth daily before breakfast.    Historical Provider, MD  lovastatin (MEVACOR) 40 MG tablet Take 40 mg by mouth every morning.      Historical Provider, MD  metoprolol tartrate (LOPRESSOR) 25 MG tablet Take 12.5 mg by mouth every morning. CAN TAKE ANOTHER 1/2TAB IF FEELS HEART RACING    Historical Provider, MD  MYRBETRIQ 25 MG TB24 tablet TAKE 1 TABLET BY MOUTH EVERY DAY 10/22/15   Noreene Filbert, MD  omeprazole (PRILOSEC) 40 MG capsule Take 40 mg by mouth every morning.     Historical Provider, MD  phenazopyridine (PYRIDIUM) 200 MG tablet Take 1 tablet (200 mg total) by mouth 3 (three) times daily as needed for pain. 05/18/15   Kathie Rhodes, MD  phenazopyridine (PYRIDIUM) 200 MG tablet Take 1 tablet (200 mg total) by mouth 3 (three) times daily as needed for pain. 07/06/15   Kathie Rhodes, MD  phenazopyridine (PYRIDIUM) 200 MG tablet Take 1 tablet (200 mg total) by mouth 3 (three) times daily as needed for pain. 09/14/15   Dorisann Frames, MD  promethazine (PHENERGAN) 25 MG tablet Take 1 tablet (25 mg total) by mouth  every 6 (six) hours as needed for nausea or vomiting. 09/10/15   Noreene Filbert, MD  ZETIA 10 MG tablet Take 10 mg by mouth daily. 06/23/15   Historical Provider, MD      VITAL SIGNS:  Blood pressure (!) 159/66, pulse 68, temperature 98.2 F (36.8 C), temperature source Oral, resp. rate 15, height 5' (1.524 m), weight 53.1 kg (117 lb), SpO2 100 %.  PHYSICAL EXAMINATION:  VITAL SIGNS: Vitals:   09/29/16 1810 09/29/16 1930  BP: (!) 163/67 (!) 159/66  Pulse: 67 68  Resp: 18 15  Temp: 98.2 F (36.8 C)    GENERAL:82 y.o.female currently in no acute distress.  HEAD: Normocephalic, atraumatic.  EYES: Pupils equal, round, reactive to light. Extraocular muscles intact. No scleral icterus.  MOUTH: Moist mucosal membrane. Dentition intact. No abscess noted.  EAR, NOSE, THROAT: Clear without exudates. No external lesions.  NECK: Supple. No thyromegaly. No nodules. No JVD.  PULMONARY: Clear to ascultation, without wheeze rails or rhonci. No use of accessory muscles, Good respiratory effort. good air entry  bilaterally CHEST: Nontender to palpation.  CARDIOVASCULAR: S1 and S2. Regular rate and rhythm. No murmurs, rubs, or gallops. No edema. Pedal pulses 2+ bilaterally.  GASTROINTESTINAL: Soft, nontender, nondistended. No masses. Positive bowel sounds. No hepatosplenomegaly.  MUSCULOSKELETAL: No swelling, clubbing, or edema. Range of motionLimited in right lower extremity given fracture NEUROLOGIC: Cranial nerves II through XII are intact. No gross focal neurological deficits. Sensation intact. Reflexes intact.  SKIN: No ulceration, lesions, rashes, or cyanosis. Skin warm and dry. Turgor intact.  PSYCHIATRIC: Mood, affect within normal limits. The patient is awake, alert and oriented x 3. Insight, judgment intact.    LABORATORY PANEL:   CBC  Recent Labs Lab 09/29/16 1848  WBC 4.0  HGB 14.2  HCT 41.1  PLT 228   ------------------------------------------------------------------------------------------------------------------  Chemistries   Recent Labs Lab 09/29/16 1848  NA 135  K 4.1  CL 100*  CO2 26  GLUCOSE 103*  BUN 24*  CREATININE 1.25*  CALCIUM 9.4   ------------------------------------------------------------------------------------------------------------------  Cardiac Enzymes No results for input(s): TROPONINI in the last 168 hours. ------------------------------------------------------------------------------------------------------------------  RADIOLOGY:  Dg Chest 1 View  Result Date: 09/29/2016 CLINICAL DATA:  Right hip fracture. Preoperative assessment. History of bladder cancer. EXAM: CHEST 1 VIEW COMPARISON:  03/17/2013 chest radiograph; overlapping portions of CT abdomen from 08/06/2016 FINDINGS: Paraseptal emphysema. Atherosclerotic calcification of the aortic arch. Old right upper lateral rib fractures, healed. Prominent bony demineralization. Small to moderate-sized hiatal hernia. Stable mild enlargement of the cardiopericardial silhouette. IMPRESSION: 1.  Emphysema. 2. Mild cardiomegaly. 3. Small to moderate hiatal hernia. 4. Bony demineralization. 5. Old healed right upper lateral rib fractures. Electronically Signed   By: Van Clines M.D.   On: 09/29/2016 19:01   Ct Hip Right Wo Contrast  Result Date: 09/29/2016 CLINICAL DATA:  Right hip pain after running. Difficulty bearing weight. EXAM: CT OF THE RIGHT HIP WITHOUT CONTRAST TECHNIQUE: Multidetector CT imaging of the right hip was performed according to the standard protocol. Multiplanar CT image reconstructions were also generated. COMPARISON:  08/06/2016 FINDINGS: Bones/Joint/Cartilage Stable synovial herniation pit anteriorly along the right femoral head. Subcapital acute intra-articular femoral neck fracture most obvious on the coronal images including images 22 through 28 of series 4. Relatively nondisplaced at this time. Ligaments Suboptimally assessed by CT. Muscles and Tendons Unremarkable Soft tissues Small right hip joint effusion. Vascular calcifications in the pelvis and along the right ovarian remnant. Uterus absent. No sciatic nerve impingement identified. IMPRESSION: 1.  Acute nondisplaced subcapital right femoral neck fracture. Small right hip joint effusion. Electronically Signed   By: Van Clines M.D.   On: 09/29/2016 18:06    EKG:   Orders placed or performed during the hospital encounter of 09/29/16  . ED EKG  . ED EKG    IMPRESSION AND PLAN:   82 year old Caucasian female history of bladder cancer, essential hypertension who is presenting with hip pain  1. Preoperative evaluation for right femur fracture: Moderate respiratory surgery from cardiovascular standpoint no further testing/intervention required prior to surgery. Continue home medications including beta blocker 2. GERD without esophagitis PPI therapy 3. Essential hypertension Lopressor 4. Hyperlipidemia unspecified: Statin therapy 5. Hypothyroidism unspecified: Synthroid     All the records are  reviewed and case discussed with ED provider. Management plans discussed with the patient, family and they are in agreement.  CODE STATUS: Full  TOTAL TIME TAKING CARE OF THIS PATIENT: 33 minutes.    Jody Mcclain,  Karenann Cai.D on 09/29/2016 at 8:06 PM  Between 7am to 6pm - Pager - [phone]  After 6pm: House Pager: - [place] [facility]  Office  [phone]  CC: Primary care physician; Glendon Axe, MD

## 2016-09-29 NOTE — Progress Notes (Signed)
Pt tolerating bucks traction without difficulty.

## 2016-09-29 NOTE — Consult Note (Signed)
ORTHOPAEDIC CONSULTATION  PATIENT NAME: Jody Mcclain DOB: 1918/11/25  MRN: [id number]  REQUESTING PHYSICIAN: Henreitta Leber, MD  Chief Complaint: Right hip pain  HPI: Jody Mcclain is a 82 y.o. female who complains of a 3 week history of right hip and groin pain. She was raking leaves in her yard when a large tree branch fell to the ground. She did not fall but apparently turned sharply and ran to avoid being struck. She noted the onset of right groin pain that progressively worsened. The pain was aggravated by weightbearing activity. She began using a walker due to the pain. She denied any radiation of the pain.  Past Medical History:  Diagnosis Date  . Arthritis    back and joints  . 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 Chronic constipation   . CKD (chronic kidney disease), stage III    pt has been referred to nephrologist by PCP, appt. is later this month July 2016  . Full dentures   . Generalized weakness    bilateral legs  . GERD (gastroesophageal reflux disease)   . Hematuria   . History of hiatal hernia   . Hyperlipidemia   . Hypertension   . Hypothyroidism   . Lower extremity edema   . Malaise and fatigue   . OAB (overactive bladder)   . Renal neoplasm   . SUI (stress urinary incontinence, female)   . Tricuspid valve regurgitation, nonrheumatic cardiologist-- dr Saralyn Pilar   severe per cardiology note   Past Surgical History:  Procedure Laterality Date  . CATARACT EXTRACTION W/ INTRAOCULAR LENS  IMPLANT, BILATERAL  2001  . CYSTOSCOPY WITH BIOPSY N/A 07/06/2015   Procedure: CYSTOSCOPY WITH BLADDER BIOPSY;  Surgeon: Kathie Rhodes, MD;  Location: [facility];  Service: Urology;  Laterality: N/A;  . TRANSTHORACIC ECHOCARDIOGRAM  04-06-2013   dr alexander paraschos   mild to moderate LVH,  ef 60%/  moderate AI , valve area 2.6cm^2/  moderate to severe TI/   mild MI  . TRANSURETHRAL RESECTION OF BLADDER TUMOR WITH GYRUS (TURBT-GYRUS) N/A 05/18/2015   Procedure: TRANSURETHRAL RESECTION OF BLADDER TUMOR WITH GYRUS (TURBT-GYRUS);  Surgeon: Kathie Rhodes, MD;  Location: [facility];  Service: Urology;  Laterality: N/A;  . VAGINAL HYSTERECTOMY  1969   Social History   Social History  . Marital status: Single    Spouse name: N/A  . Number of children: N/A  . Years of education: N/A   Social History Main Topics  . Smoking status: Former Smoker    Packs/day: 2.00    Years: 20.00    Types: Cigarettes    Quit date: 05/15/1968  . Smokeless tobacco: Never Used  . Alcohol use No  . Drug use: No  . Sexual activity: Not Asked   Other Topics Concern  . None   Social History Narrative  . None   Family History  Problem Relation Age of Onset  . Hypertension Other    Allergies  Allergen Reactions  . Augmentin [Amoxicillin-Pot Clavulanate] Nausea Only  . Boniva [Ibandronic Acid] Other (See Comments)    unknown  . Ceftin [Cefuroxime] Other (See Comments)  . Fosamax [Alendronate] Other (See Comments)    unknown  . Lipitor [Atorvastatin] Other (See Comments)    MUSCLE PAIN  . Sulfa Antibiotics Nausea Only  . Thiazide-Type Diuretics Other (See Comments)    unknown   Prior to  Admission medications   Medication Sig Start Date End Date Taking? Authorizing Provider  amLODipine (NORVASC) 10 MG tablet Take 10 mg by mouth every morning.    Yes Historical Provider, MD  aspirin EC 81 MG tablet Take 81 mg by mouth daily.   Yes Historical Provider, MD  Cholecalciferol (VITAMIN D) 2000 units CAPS Take 1 capsule by mouth daily.   Yes Historical Provider, MD  Cyanocobalamin (B-12) 1000 MCG/ML KIT Inject as directed every 30 (thirty) days.   Yes Historical Provider, MD  ferrous sulfate 325 (65 FE) MG tablet TAKE 1 TABLET (325 MG TOTAL) BY MOUTH DAILY WITH BREAKFAST. 06/07/15  Yes Historical Provider, MD  levothyroxine (SYNTHROID, LEVOTHROID) 50  MCG tablet Take 50 mcg by mouth daily before breakfast.   Yes Historical Provider, MD  lovastatin (MEVACOR) 40 MG tablet Take 40 mg by mouth every morning.    Yes Historical Provider, MD  metoprolol tartrate (LOPRESSOR) 25 MG tablet Take 25 mg by mouth daily.    Yes Historical Provider, MD  omeprazole (PRILOSEC) 40 MG capsule Take 40 mg by mouth every morning.    Yes Historical Provider, MD  ZETIA 10 MG tablet Take 10 mg by mouth daily. 06/23/15  Yes Historical Provider, MD   Dg Chest 1 View  Result Date: 09/29/2016 CLINICAL DATA:  Right hip fracture. Preoperative assessment. History of bladder cancer. EXAM: CHEST 1 VIEW COMPARISON:  03/17/2013 chest radiograph; overlapping portions of CT abdomen from 08/06/2016 FINDINGS: Paraseptal emphysema. Atherosclerotic calcification of the aortic arch. Old right upper lateral rib fractures, healed. Prominent bony demineralization. Small to moderate-sized hiatal hernia. Stable mild enlargement of the cardiopericardial silhouette. IMPRESSION: 1. Emphysema. 2. Mild cardiomegaly. 3. Small to moderate hiatal hernia. 4. Bony demineralization. 5. Old healed right upper lateral rib fractures. Electronically Signed   By: Van Clines M.D.   On: 09/29/2016 19:01   Ct Hip Right Wo Contrast  Result Date: 09/29/2016 CLINICAL DATA:  Right hip pain after running. Difficulty bearing weight. EXAM: CT OF THE RIGHT HIP WITHOUT CONTRAST TECHNIQUE: Multidetector CT imaging of the right hip was performed according to the standard protocol. Multiplanar CT image reconstructions were also generated. COMPARISON:  08/06/2016 FINDINGS: Bones/Joint/Cartilage Stable synovial herniation pit anteriorly along the right femoral head. Subcapital acute intra-articular femoral neck fracture most obvious on the coronal images including images 22 through 28 of series 4. Relatively nondisplaced at this time. Ligaments Suboptimally assessed by CT. Muscles and Tendons Unremarkable Soft tissues  Small right hip joint effusion. Vascular calcifications in the pelvis and along the right ovarian remnant. Uterus absent. No sciatic nerve impingement identified. IMPRESSION: 1. Acute nondisplaced subcapital right femoral neck fracture. Small right hip joint effusion. Electronically Signed   By: Van Clines M.D.   On: 09/29/2016 18:06    Positive ROS: All other systems have been reviewed and were otherwise negative with the exception of those mentioned in the HPI and as above.  Physical Exam: General: Alert and alert in no acute distress. HEENT: Atraumatic and normocephalic. Sclera are clear. Extraocular motion is intact. Oropharynx is clear with moist mucosa. Neck: Supple, nontender, good range of motion. No JVD or carotid bruits. Lungs: Clear to auscultation bilaterally. Cardiovascular: Regular rate and rhythm with normal S1 and S2. No murmurs. No gallops or rubs. Pedal pulses are palpable bilaterally. Homans test is negative bilaterally. No significant pretibial or ankle edema. Abdomen: Soft, nontender, and nondistended. Bowel sounds are present. Skin: No lesions in the area of chief complaint Neurologic: Awake, alert, and  oriented. Sensory function is grossly intact. Motor strength is felt to be 5 over 5 bilaterally. No clonus or tremor. Good motor coordination. Lymphatic: No axillary or cervical lymphadenopathy  MUSCULOSKELETAL: Examination is significant for findings involving the right lower extremity. Pain was elicited by logrolling of the leg or any attempts at hip range of motion. No gross shortening or rotation of the lower extremity. No knee tenderness or effusion.  Assessment: Right femoral neck fracture  Plan: The findings were discussed in detail with the patient and her family. I recommended percutaneous pinning of the femoral neck fracture. The usual perioperative course was discussed. The risks and benefits of surgical intervention were reviewed. The possibility of  nonunion or avascular necrosis were discussed with the possible need for subsequent surgery such as hip hemiarthroplasty. The patient and her family expressed understanding of the risks and benefits and agreed with plans for surgical intervention.   The surgical site was signed as per the "right site surgery" protocol.   James P. Holley Bouche M.D.

## 2016-09-30 ENCOUNTER — Inpatient Hospital Stay: Payer: Medicare Other | Admitting: Anesthesiology

## 2016-09-30 ENCOUNTER — Encounter
Admission: EM | Disposition: A | Discharge status: Home Health | Payer: Self-pay | Source: Home / Self Care | Attending: Specialist

## 2016-09-30 ENCOUNTER — Inpatient Hospital Stay: Payer: Medicare Other

## 2016-09-30 HISTORY — PX: HIP PINNING,CANNULATED: SHX1758

## 2016-09-30 LAB — SURGICAL PCR SCREEN
MRSA, PCR: NEGATIVE
STAPHYLOCOCCUS AUREUS: NEGATIVE

## 2016-09-30 SURGERY — FIXATION, FEMUR, NECK, PERCUTANEOUS, USING SCREW
Anesthesia: Spinal | Laterality: Right

## 2016-09-30 MED ORDER — FENTANYL CITRATE (PF) 100 MCG/2ML IJ SOLN
INTRAMUSCULAR | Status: DC | PRN
  Administered 2016-09-30: 25 ug via INTRAVENOUS

## 2016-09-30 MED ORDER — SODIUM CHLORIDE 0.9 % IV SOLN
INTRAVENOUS | Status: DC
  Administered 2016-09-30: 11:00:00 via INTRAVENOUS

## 2016-09-30 MED ORDER — CLINDAMYCIN PHOSPHATE 600 MG/50ML IV SOLN
INTRAVENOUS | Status: DC | PRN
  Administered 2016-09-30: 600 mg via INTRAVENOUS

## 2016-09-30 MED ORDER — LACTATED RINGERS IV SOLN
INTRAVENOUS | Status: DC | PRN
  Administered 2016-09-30: 23:00:00 via INTRAVENOUS

## 2016-09-30 MED ORDER — PROPOFOL 500 MG/50ML IV EMUL
INTRAVENOUS | Status: DC | PRN
  Administered 2016-09-30: 25 ug/kg/min via INTRAVENOUS

## 2016-09-30 MED ORDER — PHENYLEPHRINE HCL 10 MG/ML IJ SOLN
INTRAMUSCULAR | Status: DC | PRN
  Administered 2016-09-30: 50 ug via INTRAVENOUS

## 2016-09-30 SURGICAL SUPPLY — 27 items
BIT DRILL CANN LRG QC 5X300 (BIT) ×2 IMPLANT
BNDG COHESIVE 4X5 TAN STRL (GAUZE/BANDAGES/DRESSINGS) ×3 IMPLANT
BNDG COHESIVE 6X5 TAN STRL LF (GAUZE/BANDAGES/DRESSINGS) ×3 IMPLANT
CANISTER SUCT 1200ML W/VALVE (MISCELLANEOUS) ×3 IMPLANT
CATH FOL LEG HOLDER (MISCELLANEOUS) ×3 IMPLANT
DRSG DERMACEA 8X12 NADH (GAUZE/BANDAGES/DRESSINGS) ×3 IMPLANT
DRSG OPSITE POSTOP 3X4 (GAUZE/BANDAGES/DRESSINGS) ×3 IMPLANT
DRSG OPSITE POSTOP 4X8 (GAUZE/BANDAGES/DRESSINGS) ×2 IMPLANT
DURAPREP 26ML APPLICATOR (WOUND CARE) ×3 IMPLANT
ELECT REM PT RETURN 9FT ADLT (ELECTROSURGICAL) ×3
ELECTRODE REM PT RTRN 9FT ADLT (ELECTROSURGICAL) ×1 IMPLANT
GLOVE BIOGEL M STRL SZ7.5 (GLOVE) ×7 IMPLANT
GLOVE INDICATOR 8.0 STRL GRN (GLOVE) ×7 IMPLANT
GOWN STRL REUS W/ TWL LRG LVL3 (GOWN DISPOSABLE) ×2 IMPLANT
GOWN STRL REUS W/TWL LRG LVL3 (GOWN DISPOSABLE) ×9
GUIDEWIRE THREADED 2.8MM (WIRE) ×6 IMPLANT
KIT RM TURNOVER CYSTO AR (KITS) ×3 IMPLANT
NS IRRIG 1000ML POUR BTL (IV SOLUTION) ×3 IMPLANT
PACK HIP COMPR (MISCELLANEOUS) ×3 IMPLANT
SCREW CANN 16 THRD/85 7.3 (Screw) ×4 IMPLANT
SCREW CANN 16 THRD/95 7.3 (Screw) ×2 IMPLANT
SOL PREP PVP 2OZ (MISCELLANEOUS) ×3
SOLUTION PREP PVP 2OZ (MISCELLANEOUS) ×1 IMPLANT
STAPLER SKIN PROX 35W (STAPLE) ×3 IMPLANT
SUT VIC AB 0 CT2 27 (SUTURE) ×3 IMPLANT
SUT VIC AB 1 CT1 36 (SUTURE) ×6 IMPLANT
SUT VIC AB 2-0 CT2 27 (SUTURE) ×3 IMPLANT

## 2016-09-30 NOTE — Progress Notes (Signed)
[place] [facility] [place] NAME: Jody Mcclain    MR#:  [id number]  DATE OF BIRTH:  13-Feb-1919  SUBJECTIVE:   Pt. Here due to mechanical fall and noted to have a right Hip Fracture.  Right leg in traction.  Going to OR later today.    REVIEW OF SYSTEMS:    Review of Systems  Constitutional: Negative for chills and fever.  HENT: Negative for congestion and tinnitus.   Eyes: Negative for blurred vision and double vision.  Respiratory: Negative for cough, shortness of breath and wheezing.   Cardiovascular: Negative for chest pain, orthopnea and PND.  Gastrointestinal: Negative for abdominal pain, diarrhea, nausea and vomiting.  Genitourinary: Negative for dysuria and hematuria.  Musculoskeletal: Positive for falls and joint pain (right Hip Pain. ).  Neurological: Negative for dizziness, sensory change and focal weakness.  All other systems reviewed and are negative.   Nutrition: NPO for surgery later Tolerating Diet: NPO Tolerating PT: Await Eval Post-op     DRUG ALLERGIES:   Allergies  Allergen Reactions  . Augmentin [Amoxicillin-Pot Clavulanate] Nausea Only  . Boniva [Ibandronic Acid] Other (See Comments)    unknown  . Ceftin [Cefuroxime] Other (See Comments)  . Fosamax [Alendronate] Other (See Comments)    unknown  . Lipitor [Atorvastatin] Other (See Comments)    MUSCLE PAIN  . Sulfa Antibiotics Nausea Only  . Thiazide-Type Diuretics Other (See Comments)    unknown    VITALS:  Blood pressure (!) 108/51, pulse 65, temperature 98.5 F (36.9 C), temperature source Oral, resp. rate 18, height 5' (1.524 m), weight 53.1 kg (117 lb), SpO2 98 %.  PHYSICAL EXAMINATION:   Physical Exam  GENERAL:  82 y.o.-year-old patient lying in the bed in no acute distress.  EYES: Pupils equal, round, reactive to light and accommodation. No scleral icterus. Extraocular muscles intact.  HEENT: Head atraumatic, normocephalic. Oropharynx and  nasopharynx clear.  NECK:  Supple, no jugular venous distention. No thyroid enlargement, no tenderness.  LUNGS: Normal breath sounds bilaterally, no wheezing, rales, rhonchi. No use of accessory muscles of respiration.  CARDIOVASCULAR: S1, S2 normal. No murmurs, rubs, or gallops.  ABDOMEN: Soft, nontender, nondistended. Bowel sounds present. No organomegaly or mass.  EXTREMITIES: No cyanosis, clubbing or edema b/l.   Right LE in Traction.  NEUROLOGIC: Cranial nerves II through XII are intact. No focal Motor or sensory deficits b/l.   PSYCHIATRIC: The patient is alert and oriented x 3.  SKIN: No obvious rash, lesion, or ulcer.    LABORATORY PANEL:   CBC  Recent Labs Lab 09/29/16 1848  WBC 4.0  HGB 14.2  HCT 41.1  PLT 228   ------------------------------------------------------------------------------------------------------------------  Chemistries   Recent Labs Lab 09/29/16 1848  NA 135  K 4.1  CL 100*  CO2 26  GLUCOSE 103*  BUN 24*  CREATININE 1.25*  CALCIUM 9.4   ------------------------------------------------------------------------------------------------------------------  Cardiac Enzymes No results for input(s): TROPONINI in the last 168 hours. ------------------------------------------------------------------------------------------------------------------  RADIOLOGY:  Dg Chest 1 View  Result Date: 09/29/2016 CLINICAL DATA:  Right hip fracture. Preoperative assessment. History of bladder cancer. EXAM: CHEST 1 VIEW COMPARISON:  03/17/2013 chest radiograph; overlapping portions of CT abdomen from 08/06/2016 FINDINGS: Paraseptal emphysema. Atherosclerotic calcification of the aortic arch. Old right upper lateral rib fractures, healed. Prominent bony demineralization. Small to moderate-sized hiatal hernia. Stable mild enlargement of the cardiopericardial silhouette. IMPRESSION: 1. Emphysema. 2. Mild cardiomegaly. 3. Small to moderate hiatal hernia. 4. Bony  demineralization. 5. Old  healed right upper lateral rib fractures. Electronically Signed   By: Van Clines M.D.   On: 09/29/2016 19:01   Ct Hip Right Wo Contrast  Result Date: 09/29/2016 CLINICAL DATA:  Right hip pain after running. Difficulty bearing weight. EXAM: CT OF THE RIGHT HIP WITHOUT CONTRAST TECHNIQUE: Multidetector CT imaging of the right hip was performed according to the standard protocol. Multiplanar CT image reconstructions were also generated. COMPARISON:  08/06/2016 FINDINGS: Bones/Joint/Cartilage Stable synovial herniation pit anteriorly along the right femoral head. Subcapital acute intra-articular femoral neck fracture most obvious on the coronal images including images 22 through 28 of series 4. Relatively nondisplaced at this time. Ligaments Suboptimally assessed by CT. Muscles and Tendons Unremarkable Soft tissues Small right hip joint effusion. Vascular calcifications in the pelvis and along the right ovarian remnant. Uterus absent. No sciatic nerve impingement identified. IMPRESSION: 1. Acute nondisplaced subcapital right femoral neck fracture. Small right hip joint effusion. Electronically Signed   By: Van Clines M.D.   On: 09/29/2016 18:06     ASSESSMENT AND PLAN:   82 year old female with past medical history of hypothyroidism, hypertension, hyperlipidemia, history of hiatal hernia, GERD, chronic kidney disease stage III, history of bladder cancer who presented to the hospital after mechanical fall and noted to have a right hip fracture.  1. Right hip fracture-secondary to mechanical fall. Patient is a low risk for noncardiac surgery and is going to OR later today. -Continue pain care control and further care as per orthopedics.  2. Essential hypertension-continue Norvasc, metoprolol.  3. Hypothyroidism-continue Synthroid.  4. Chronic kidney disease stage III-patient's creatinine is close to baseline.  5. Lipidemia-continue Pravachol.  6.  GERD-continue Protonix.     All the records are reviewed and case discussed with Care Management/Social Worker. Management plans discussed with the patient, family and they are in agreement.  CODE STATUS: Full  DVT Prophylaxis: Teds and SCDs  TOTAL TIME TAKING CARE OF THIS PATIENT: 30 minutes.   POSSIBLE D/C IN 2-3 DAYS, DEPENDING ON CLINICAL CONDITION.   Henreitta Leber M.D on 09/30/2016 at 2:19 PM  Between 7am to 6pm - Pager - [phone]  After 6pm go to [email or web address] - Proofreader  [facility]  Office  [phone]  CC: Primary care physician; Glendon Axe, MD

## 2016-09-30 NOTE — Anesthesia Preprocedure Evaluation (Addendum)
Anesthesia Evaluation  Patient identified by MRN, date of birth, ID band Patient awake and Patient unresponsive    Reviewed: Allergy & Precautions, H&P , NPO status , Patient's Chart, lab work & pertinent test results, reviewed documented beta blocker date and time   Airway Mallampati: II  TM Distance: >3 FB Neck ROM: full    Dental  (+) Teeth Intact   Pulmonary neg pulmonary ROS, former smoker,    Pulmonary exam normal        Cardiovascular hypertension, negative cardio ROS Normal cardiovascular exam Rhythm:regular Rate:Normal     Neuro/Psych  Neuromuscular disease negative neurological ROS  negative psych ROS   GI/Hepatic negative GI ROS, Neg liver ROS, hiatal hernia, GERD  Medicated,  Endo/Other  negative endocrine ROSHypothyroidism   Renal/GU Renal diseasenegative Renal ROS  negative genitourinary   Musculoskeletal   Abdominal   Peds  Hematology negative hematology ROS (+)   Anesthesia Other Findings Past Medical History: No date: Arthritis     Comment: back and joints No date: Bladder cancer [facility])     Comment: urologist--  dr Karsten Ro--  dx High-Grade               Transitional cell carcinoma of bladder w/               lymphovascular invasion No date: Chronic constipation No date: CKD (chronic kidney disease), stage III     Comment: pt has been referred to nephrologist by PCP,               appt. is later this month July 2016 No date: Full dentures No date: Generalized weakness     Comment: bilateral legs No date: GERD (gastroesophageal reflux disease) No date: Hematuria No date: History of hiatal hernia No date: Hyperlipidemia No date: Hypertension No date: Hypothyroidism No date: Lower extremity edema No date: Malaise and fatigue No date: OAB (overactive bladder) No date: Renal neoplasm No date: SUI (stress urinary incontinence, female) cardiologist-- dr Saralyn Pilar: Tricuspid valve regurgitation,  nonrheumatic     Comment: severe per cardiology note Past Surgical History: 2001: CATARACT EXTRACTION W/ INTRAOCULAR LENS  IMPLA* 07/06/2015: CYSTOSCOPY WITH BIOPSY N/A     Comment: Procedure: CYSTOSCOPY WITH BLADDER BIOPSY;                Surgeon: Kathie Rhodes, MD;  Location: [place];  Service: Urology;                Laterality: N/A; 04-06-2013   dr Sheppard Coil paraschos: TRANSTHORACIC ECHOCARDIOGRAM     Comment: mild to moderate LVH,  ef 60%/  moderate AI ,               valve area 2.6cm^2/  moderate to severe TI/                mild MI 05/18/2015: TRANSURETHRAL RESECTION OF BLADDER TUMOR WITH * N/A     Comment: Procedure: TRANSURETHRAL RESECTION OF BLADDER               TUMOR WITH GYRUS (TURBT-GYRUS);  Surgeon: Kathie Rhodes, MD;  Location: [facility];  Service: Urology;  Laterality: N/A; 1969: VAGINAL HYSTERECTOMY BMI    Body Mass Index:  22.85 kg/m  Reproductive/Obstetrics negative OB ROS                            Anesthesia Physical Anesthesia Plan  ASA: III and emergent  Anesthesia Plan: General ETT   Post-op Pain Management:    Induction:   Airway Management Planned:   Additional Equipment:   Intra-op Plan:   Post-operative Plan:   Informed Consent: I have reviewed the patients History and Physical, chart, labs and discussed the procedure including the risks, benefits and alternatives for the proposed anesthesia with the patient or authorized representative who has indicated his/her understanding and acceptance.   Dental Advisory Given  Plan Discussed with: CRNA  Anesthesia Plan Comments:         Anesthesia Quick Evaluation

## 2016-09-30 NOTE — NC FL2 (Signed)
[place] LEVEL OF CARE SCREENING TOOL     IDENTIFICATION  Patient Name: Jody Mcclain Birthdate: [birth year] Sex: female Admission Date (Current Location): 09/29/2016  [place] and [place] Number:  Engineering geologist and Address:  [facility], [address] [place], [place] [address]      Provider Number: [id number]  Attending Physician Name and Address:  Henreitta Leber, MD  Relative Name and Phone Number:       Current Level of Care: Hospital Recommended Level of Care: [place] Prior Approval Number:    Date Approved/Denied:   PASRR Number:  [id number])  Discharge Plan: SNF    Current Diagnoses: Patient Active Problem List   Diagnosis Date Noted  . Closed right hip fracture, initial encounter ([place]) 09/29/2016    Orientation RESPIRATION BLADDER Height & Weight     Self, Time, Situation, Place  Normal Continent Weight: 117 lb (53.1 kg) Height:  5' (152.4 cm)  BEHAVIORAL SYMPTOMS/MOOD NEUROLOGICAL BOWEL NUTRITION STATUS   (None.)  (None.) Continent Diet (Diet: NPO due to surgery )  AMBULATORY STATUS COMMUNICATION OF NEEDS Skin   Extensive Assist Verbally Normal                       Personal Care Assistance Level of Assistance  Bathing, Feeding, Dressing Bathing Assistance: Limited assistance Feeding assistance: Independent Dressing Assistance: Limited assistance     Functional Limitations Info  Sight, Hearing, Speech Sight Info: Adequate Hearing Info: Adequate Speech Info: Adequate    SPECIAL CARE FACTORS FREQUENCY  PT (By licensed PT), OT (By licensed OT)     PT Frequency:  (5) OT Frequency:  (5)            Contractures      Additional Factors Info  Code Status, Allergies Code Status Info:  (Full Code) Allergies Info:  ( Augmentin Amoxicillin-pot Clavulanate, Boniva Ibandronic Acid, Ceftin Cefuroxime, Fosamax Alendronate, Lipitor Atorvastatin, Sulfa Antibiotics,  Thiazide-type Diuretics)           Current Medications (09/30/2016):  This is the current hospital active medication list Current Facility-Administered Medications  Medication Dose Route Frequency Provider Last Rate Last Dose  . 0.9 %  sodium chloride infusion   Intravenous Continuous Dereck Leep, MD      . acetaminophen (TYLENOL) tablet 650 mg  650 mg Oral Q6H PRN Lytle Butte, MD       Or  . acetaminophen (TYLENOL) suppository 650 mg  650 mg Rectal Q6H PRN Lytle Butte, MD      . amLODipine (NORVASC) tablet 10 mg  10 mg Oral q morning - 10a Lytle Butte, MD      . aspirin EC tablet 81 mg  81 mg Oral Daily Lytle Butte, MD      . clindamycin (CLEOCIN) IVPB 600 mg  600 mg Intravenous To OR Dereck Leep, MD      . diazepam (VALIUM) tablet 5 mg  5 mg Oral Q6H PRN Lytle Butte, MD      . docusate sodium (COLACE) capsule 100 mg  100 mg Oral BID Lytle Butte, MD   100 mg at 09/29/16 2147  . ezetimibe (ZETIA) tablet 10 mg  10 mg Oral Daily Lytle Butte, MD   10 mg at 09/29/16 2147  . ferrous sulfate tablet 325 mg  325 mg Oral Q breakfast Lytle Butte, MD      . levothyroxine (SYNTHROID,  LEVOTHROID) tablet 50 mcg  50 mcg Oral QAC breakfast Lytle Butte, MD   50 mcg at 09/30/16 0517  . metoprolol tartrate (LOPRESSOR) tablet 12.5 mg  12.5 mg Oral q morning - 10a Lytle Butte, MD      . mirabegron ER [facility]) tablet 25 mg  25 mg Oral Daily Lytle Butte, MD      . morphine 4 MG/ML injection 2 mg  2 mg Intravenous Once Nance Pear, MD      . morphine 4 MG/ML injection 2 mg  2 mg Intravenous Q4H PRN Lytle Butte, MD   2 mg at 09/30/16 0027  . ondansetron (ZOFRAN) tablet 4 mg  4 mg Oral Q6H PRN Lytle Butte, MD       Or  . ondansetron [facility]) injection 4 mg  4 mg Intravenous Q6H PRN Lytle Butte, MD      . oxyCODONE (Oxy IR/ROXICODONE) immediate release tablet 5 mg  5 mg Oral Q4H PRN Lytle Butte, MD   5 mg at 09/29/16 2147  . pantoprazole (PROTONIX) EC tablet 40 mg  40 mg  Oral QAC breakfast Lance Coon, MD   40 mg at 09/29/16 2209  . polyethylene glycol (MIRALAX / GLYCOLAX) packet 17 g  17 g Oral Daily PRN Lytle Butte, MD      . pravastatin (PRAVACHOL) tablet 40 mg  40 mg Oral q1800 Lytle Butte, MD         Discharge Medications: Please see discharge summary for a list of discharge medications.  Relevant Imaging Results:  Relevant Lab Results:   Additional Information  (SSN: [id number])  Danie Chandler, Student-Social Work

## 2016-09-30 NOTE — Clinical Social Work Note (Signed)
Clinical Social Work Assessment  Patient Details  Name: Jody Mcclain MRN: [id number] Date of Birth: 1919/10/21  Date of referral:  09/30/16               Reason for consult:  Facility Placement, Discharge Planning                Permission sought to share information with:  Chartered certified accountant granted to share information::  Yes, Verbal Permission Granted  Name::      [place]::   [place]   Relationship::     Contact Information:     Housing/Transportation Living arrangements for the past 2 months:  [place] of Information:  Patient Patient Interpreter Needed:  None Criminal Activity/Legal Involvement Pertinent to Current Situation/Hospitalization:  No - Comment as needed Significant Relationships:  Adult Children Lives with:  Adult Children Do you feel safe going back to the place where you live?  Yes Need for family participation in patient care:  Yes (Comment)  Care giving concerns:  Patient lives with her daughter Jody Mcclain and son-in-law in [place].    Social Worker assessment / plan:  Holiday representative (CSW) received social work consult. PT has not worked with patient at this time. Patient is scheduled to have surgery later today. Social work Theatre manager met with patient at bedside. Patient was alert and oriented. Social work Theatre manager introduced herself and explained social work department role. Per patient, she lives with her daughter Jody Mcclain and her son-in-law at their house in [place]. Patient also has two sons and two other daughters that live in the area. Patient explained that daughter Jody Mcclain takes care of her. Patient wants daughter Jody Mcclain involved with all care plans.  Social work Theatre manager explained that after patient has surgery, physical therapy (PT) will come in and work with patient. Social work Theatre manager explained to patient that PT will recommend if patient can go home and receive home health or needs to  go to SNF for short-term medicine. Patient is preferring to go home, as she says her daughter will be able to take care of her. Patient did not want social work Theatre manager to send her information to facilities at this time. Patient was unsure if her daughter Jody Mcclain was her HPOA.   Fl2 completed.   Employment status:  Retired Nurse, adult PT Recommendations:  Not assessed at this time Information / Referral to community resources:  [place]  Patient/Family's Response to care:  Patient is wanting to go home after surgery.   Patient/Family's Understanding of and Emotional Response to Diagnosis, Current Treatment, and Prognosis:  Patient was pleasant and thanked social work Theatre manager for coming by.   Emotional Assessment Appearance:  Appears stated age Attitude/Demeanor/Rapport:    Affect (typically observed):  Accepting, Adaptable, Appropriate Orientation:  Oriented to Self, Oriented to Place, Oriented to  Time, Oriented to Situation Alcohol / Substance use:  Never Used Psych involvement (Current and /or in the community):  No (Comment)  Discharge Needs  Concerns to be addressed:  Basic Needs, Discharge Planning Concerns Readmission within the last 30 days:  No Current discharge risk:  Dependent with Mobility Barriers to Discharge:  Continued Medical Work up   [employer], [place] Work 09/30/2016, 9:56 AM

## 2016-10-01 ENCOUNTER — Encounter: Payer: Self-pay | Admitting: Orthopedic Surgery

## 2016-10-01 LAB — CBC
HEMATOCRIT: 36.2 % (ref 35.0–47.0)
HEMOGLOBIN: 12.8 g/dL (ref 12.0–16.0)
MCH: 31.6 pg (ref 26.0–34.0)
MCHC: 35.4 g/dL (ref 32.0–36.0)
MCV: 89.4 fL (ref 80.0–100.0)
Platelets: 174 10*3/uL (ref 150–440)
RBC: 4.04 MIL/uL (ref 3.80–5.20)
RDW: 13.1 % (ref 11.5–14.5)
WBC: 4 10*3/uL (ref 3.6–11.0)

## 2016-10-01 LAB — BASIC METABOLIC PANEL
Anion gap: 6 (ref 5–15)
BUN: 17 mg/dL (ref 6–20)
CALCIUM: 8.5 mg/dL — AB (ref 8.9–10.3)
CHLORIDE: 106 mmol/L (ref 101–111)
CO2: 26 mmol/L (ref 22–32)
CREATININE: 1.04 mg/dL — AB (ref 0.44–1.00)
GFR calc Af Amer: 51 mL/min — ABNORMAL LOW (ref >60)
GFR calc non Af Amer: 44 mL/min — ABNORMAL LOW (ref >60)
GLUCOSE: 89 mg/dL (ref 65–99)
Potassium: 3.8 mmol/L (ref 3.5–5.1)
Sodium: 138 mmol/L (ref 135–145)

## 2016-10-01 MED ORDER — PHENOL 1.4 % MT LIQD
1.0000 | OROMUCOSAL | Status: DC | PRN
  Filled 2016-10-01: qty 177

## 2016-10-01 MED ORDER — ONDANSETRON HCL 4 MG/2ML IJ SOLN: 4.0000 mg | Freq: Four times a day (QID) | INTRAMUSCULAR | Status: DC | PRN

## 2016-10-01 MED ORDER — MENTHOL 3 MG MT LOZG
1.0000 | LOZENGE | OROMUCOSAL | Status: DC | PRN
  Filled 2016-10-01: qty 9

## 2016-10-01 MED ORDER — FENTANYL CITRATE (PF) 100 MCG/2ML IJ SOLN: 25.0000 ug | INTRAMUSCULAR | Status: DC | PRN

## 2016-10-01 MED ORDER — CLINDAMYCIN PHOSPHATE 600 MG/50ML IV SOLN
600.0000 mg | Freq: Four times a day (QID) | INTRAVENOUS | Status: AC
  Administered 2016-10-01 (×2): 600 mg via INTRAVENOUS
  Filled 2016-10-01 (×2): qty 50

## 2016-10-01 MED ORDER — ACETAMINOPHEN 325 MG PO TABS: 650.0000 mg | ORAL_TABLET | Freq: Four times a day (QID) | ORAL | Status: DC | PRN

## 2016-10-01 MED ORDER — MAGNESIUM HYDROXIDE 400 MG/5ML PO SUSP
30.0000 mL | Freq: Every day | ORAL | Status: DC | PRN
  Administered 2016-10-02: 30 mL via ORAL
  Filled 2016-10-01: qty 30

## 2016-10-01 MED ORDER — BISACODYL 10 MG RE SUPP
10.0000 mg | Freq: Every day | RECTAL | Status: DC | PRN
  Administered 2016-10-01: 10 mg via RECTAL
  Filled 2016-10-01: qty 1

## 2016-10-01 MED ORDER — ONDANSETRON HCL 4 MG PO TABS: 4.0000 mg | ORAL_TABLET | Freq: Four times a day (QID) | ORAL | Status: DC | PRN

## 2016-10-01 MED ORDER — NEOMYCIN-POLYMYXIN B GU 40-200000 IR SOLN
Status: DC | PRN
  Administered 2016-10-01: 4 mL

## 2016-10-01 MED ORDER — ONDANSETRON HCL 4 MG/2ML IJ SOLN: 4.0000 mg | Freq: Once | INTRAMUSCULAR | Status: DC | PRN

## 2016-10-01 MED ORDER — ACETAMINOPHEN 650 MG RE SUPP: 650.0000 mg | Freq: Four times a day (QID) | RECTAL | Status: DC | PRN

## 2016-10-01 MED ORDER — TRAMADOL HCL 50 MG PO TABS: 50.0000 mg | ORAL_TABLET | ORAL | Status: DC | PRN

## 2016-10-01 MED ORDER — OXYCODONE HCL 5 MG PO TABS
5.0000 mg | ORAL_TABLET | ORAL | Status: DC | PRN
  Administered 2016-10-01 – 2016-10-02 (×2): 5 mg via ORAL
  Filled 2016-10-01 (×2): qty 1

## 2016-10-01 MED ORDER — VITAMIN D 1000 UNITS PO TABS
2000.0000 [IU] | ORAL_TABLET | Freq: Every day | ORAL | Status: DC
  Administered 2016-10-01 – 2016-10-02 (×2): 2000 [IU] via ORAL
  Filled 2016-10-01 (×2): qty 2

## 2016-10-01 MED ORDER — MORPHINE SULFATE (PF) 2 MG/ML IV SOLN: 2.0000 mg | INTRAVENOUS | Status: DC | PRN

## 2016-10-01 MED ORDER — ACETAMINOPHEN 10 MG/ML IV SOLN
1000.0000 mg | Freq: Four times a day (QID) | INTRAVENOUS | Status: AC
  Administered 2016-10-01 (×4): 1000 mg via INTRAVENOUS
  Filled 2016-10-01 (×3): qty 100

## 2016-10-01 MED ORDER — METOCLOPRAMIDE HCL 10 MG PO TABS
5.0000 mg | ORAL_TABLET | Freq: Three times a day (TID) | ORAL | Status: DC | PRN
  Administered 2016-10-02: 10 mg via ORAL
  Filled 2016-10-01: qty 1

## 2016-10-01 MED ORDER — METOCLOPRAMIDE HCL 5 MG/ML IJ SOLN: 5.0000 mg | Freq: Three times a day (TID) | INTRAMUSCULAR | Status: DC | PRN

## 2016-10-01 MED ORDER — SENNOSIDES-DOCUSATE SODIUM 8.6-50 MG PO TABS
1.0000 | ORAL_TABLET | Freq: Two times a day (BID) | ORAL | Status: DC
  Administered 2016-10-01 – 2016-10-02 (×4): 1 via ORAL
  Filled 2016-10-01 (×4): qty 1

## 2016-10-01 MED ORDER — ACETAMINOPHEN 10 MG/ML IV SOLN
INTRAVENOUS | Status: AC
  Administered 2016-10-01: 1000 mg via INTRAVENOUS
  Filled 2016-10-01: qty 100

## 2016-10-01 MED ORDER — FLEET ENEMA 7-19 GM/118ML RE ENEM: 1.0000 | ENEMA | Freq: Once | RECTAL | Status: DC | PRN

## 2016-10-01 MED ORDER — SODIUM CHLORIDE 0.9 % IV SOLN
INTRAVENOUS | Status: DC
  Administered 2016-10-01: 02:00:00 via INTRAVENOUS

## 2016-10-01 MED ORDER — ENOXAPARIN SODIUM 30 MG/0.3ML ~~LOC~~ SOLN
30.0000 mg | SUBCUTANEOUS | Status: DC
  Administered 2016-10-02: 30 mg via SUBCUTANEOUS
  Filled 2016-10-01: qty 0.3

## 2016-10-01 NOTE — Transfer of Care (Signed)
Immediate Anesthesia Transfer of Care Note  Patient: Jody Mcclain  Procedure(s) Performed: Procedure(s): CANNULATED HIP PINNING (Right)  Patient Location: PACU  Anesthesia Type:Spinal  Level of Consciousness: awake, alert , oriented and patient cooperative  Airway & Oxygen Therapy: Patient Spontanous Breathing  Post-op Assessment: Report given to RN and Post -op Vital signs reviewed and stable  Post vital signs: Reviewed and stable  Last Vitals:  Vitals:   09/30/16 1501 09/30/16 2043  BP: (!) 161/65 (!) 155/66  Pulse: 73 70  Resp: 16 19  Temp: 36.7 C 36.8 C    Last Pain:  Vitals:   09/30/16 2043  TempSrc: Oral  PainSc:       Patients Stated Pain Goal: 2 (22/24/11 4643)  Complications: No apparent anesthesia complications

## 2016-10-01 NOTE — Progress Notes (Signed)
[facility] NAME: Jody Mcclain    MR#:  [id number]  DATE OF BIRTH:  1918-12-29  SUBJECTIVE:   Pt. Here due to mechanical fall and noted to have a right Hip Fracture.  S/p Percutaneous pinning POD # 1.  No acute events overnight and tolerated PT well.    REVIEW OF SYSTEMS:    Review of Systems  Constitutional: Negative for chills and fever.  HENT: Negative for congestion and tinnitus.   Eyes: Negative for blurred vision and double vision.  Respiratory: Negative for cough, shortness of breath and wheezing.   Cardiovascular: Negative for chest pain, orthopnea and PND.  Gastrointestinal: Negative for abdominal pain, diarrhea, nausea and vomiting.  Genitourinary: Negative for dysuria and hematuria.  Musculoskeletal: Positive for falls and joint pain (right Hip Pain. ).  Neurological: Negative for dizziness, sensory change and focal weakness.  All other systems reviewed and are negative.   Nutrition: Regular  Tolerating Diet: Yes Tolerating PT: Eval noted.    DRUG ALLERGIES:   Allergies  Allergen Reactions  . Augmentin [Amoxicillin-Pot Clavulanate] Nausea Only  . Boniva [Ibandronic Acid] Other (See Comments)    unknown  . Ceftin [Cefuroxime] Other (See Comments)  . Fosamax [Alendronate] Other (See Comments)    unknown  . Lipitor [Atorvastatin] Other (See Comments)    MUSCLE PAIN  . Sulfa Antibiotics Nausea Only  . Thiazide-Type Diuretics Other (See Comments)    unknown    VITALS:  Blood pressure (!) 103/45, pulse (!) 59, temperature 98.4 F (36.9 C), temperature source Oral, resp. rate 16, height 5' (1.524 m), weight 53.1 kg (117 lb), SpO2 90 %.  PHYSICAL EXAMINATION:   Physical Exam  GENERAL:  82 y.o.-year-old patient lying in the bed in no acute distress.  EYES: Pupils equal, round, reactive to light and accommodation. No scleral icterus. Extraocular muscles intact.  HEENT: Head atraumatic, normocephalic.  Oropharynx and nasopharynx clear.  NECK:  Supple, no jugular venous distention. No thyroid enlargement, no tenderness.  LUNGS: Normal breath sounds bilaterally, no wheezing, rales, rhonchi. No use of accessory muscles of respiration.  CARDIOVASCULAR: S1, S2 normal. No murmurs, rubs, or gallops.  ABDOMEN: Soft, nontender, nondistended. Bowel sounds present. No organomegaly or mass.  EXTREMITIES: No cyanosis, clubbing or edema b/l. Right hip dressing from surgery.  NEUROLOGIC: Cranial nerves II through XII are intact. No focal Motor or sensory deficits b/l.   PSYCHIATRIC: The patient is alert and oriented x 3.  SKIN: No obvious rash, lesion, or ulcer.    LABORATORY PANEL:   CBC  Recent Labs Lab 10/01/16 0435  WBC 4.0  HGB 12.8  HCT 36.2  PLT 174   ------------------------------------------------------------------------------------------------------------------  Chemistries   Recent Labs Lab 10/01/16 0435  NA 138  K 3.8  CL 106  CO2 26  GLUCOSE 89  BUN 17  CREATININE 1.04*  CALCIUM 8.5*   ------------------------------------------------------------------------------------------------------------------  Cardiac Enzymes No results for input(s): TROPONINI in the last 168 hours. ------------------------------------------------------------------------------------------------------------------  RADIOLOGY:  Dg Chest 1 View  Result Date: 09/29/2016 CLINICAL DATA:  Right hip fracture. Preoperative assessment. History of bladder cancer. EXAM: CHEST 1 VIEW COMPARISON:  03/17/2013 chest radiograph; overlapping portions of CT abdomen from 08/06/2016 FINDINGS: Paraseptal emphysema. Atherosclerotic calcification of the aortic arch. Old right upper lateral rib fractures, healed. Prominent bony demineralization. Small to moderate-sized hiatal hernia. Stable mild enlargement of the cardiopericardial silhouette. IMPRESSION: 1. Emphysema. 2. Mild cardiomegaly. 3. Small to moderate hiatal  hernia. 4. Bony demineralization. 5.  Old healed right upper lateral rib fractures. Electronically Signed   By: Van Clines M.D.   On: 09/29/2016 19:01   Ct Hip Right Wo Contrast  Result Date: 09/29/2016 CLINICAL DATA:  Right hip pain after running. Difficulty bearing weight. EXAM: CT OF THE RIGHT HIP WITHOUT CONTRAST TECHNIQUE: Multidetector CT imaging of the right hip was performed according to the standard protocol. Multiplanar CT image reconstructions were also generated. COMPARISON:  08/06/2016 FINDINGS: Bones/Joint/Cartilage Stable synovial herniation pit anteriorly along the right femoral head. Subcapital acute intra-articular femoral neck fracture most obvious on the coronal images including images 22 through 28 of series 4. Relatively nondisplaced at this time. Ligaments Suboptimally assessed by CT. Muscles and Tendons Unremarkable Soft tissues Small right hip joint effusion. Vascular calcifications in the pelvis and along the right ovarian remnant. Uterus absent. No sciatic nerve impingement identified. IMPRESSION: 1. Acute nondisplaced subcapital right femoral neck fracture. Small right hip joint effusion. Electronically Signed   By: Van Clines M.D.   On: 09/29/2016 18:06   Dg C-arm 1-60 Min  Result Date: 10/01/2016 CLINICAL DATA:  Internal fixation of right femoral fracture. Initial encounter. EXAM: OPERATIVE RIGHT HIP (WITH PELVIS IF PERFORMED)  VIEWS TECHNIQUE: Fluoroscopic spot image(s) were submitted for interpretation post-operatively. COMPARISON:  CT of the right hip performed 09/29/2016 FINDINGS: Pins are noted transfixing the patient's subcapital right femoral neck fracture in grossly anatomic alignment. The right femoral head remains seated at the acetabulum. No new fractures are seen. IMPRESSION: Status post internal fixation of subcapital right femoral neck fracture in grossly anatomic alignment. Electronically Signed   By: Garald Balding M.D.   On: 10/01/2016 02:08    Dg Hip Operative Unilat W Or W/o Pelvis Right  Result Date: 10/01/2016 CLINICAL DATA:  Internal fixation of right femoral fracture. Initial encounter. EXAM: OPERATIVE RIGHT HIP (WITH PELVIS IF PERFORMED)  VIEWS TECHNIQUE: Fluoroscopic spot image(s) were submitted for interpretation post-operatively. COMPARISON:  CT of the right hip performed 09/29/2016 FINDINGS: Pins are noted transfixing the patient's subcapital right femoral neck fracture in grossly anatomic alignment. The right femoral head remains seated at the acetabulum. No new fractures are seen. IMPRESSION: Status post internal fixation of subcapital right femoral neck fracture in grossly anatomic alignment. Electronically Signed   By: Garald Balding M.D.   On: 10/01/2016 02:08     ASSESSMENT AND PLAN:   82 year old female with past medical history of hypothyroidism, hypertension, hyperlipidemia, history of hiatal hernia, GERD, chronic kidney disease stage III, history of bladder cancer who presented to the hospital after mechanical fall and noted to have a right hip fracture.  1. Right hip fracture- s/p Percutaneous pinning POD # 1.  - cont. Pain control and care as per PT.   - possible d/c home with health tomorrow.  - appreciate Ortho input.   2. Essential hypertension-continue Norvasc, metoprolol.  3. Hypothyroidism-continue Synthroid.  4. Chronic kidney disease stage III-patient's creatinine is close to baseline. - cont. To follow.   5. Lipidemia-continue Pravachol.  6. GERD-continue Protonix.  Possible d/c home tomorrow.    All the records are reviewed and case discussed with Care Management/Social Worker. Management plans discussed with the patient, family and they are in agreement.  CODE STATUS: Full  DVT Prophylaxis: Lovenox.  TOTAL TIME TAKING CARE OF THIS PATIENT: 25 minutes.   POSSIBLE D/C IN 1-2 DAYS, DEPENDING ON CLINICAL CONDITION.   Henreitta Leber M.D on 10/01/2016 at 2:02 PM  Between 7am to  6pm - Pager - [phone]  After  6pm go to [email or web address] - Proofreader  [employer] [facility]  Office  [phone]  CC: Primary care physician; Glendon Axe, MD

## 2016-10-01 NOTE — Progress Notes (Signed)
PHARMACIST - PHYSICIAN COMMUNICATION  CONCERNING:  Enoxaparin (Lovenox) for DVT Prophylaxis    RECOMMENDATION: Patient was prescribed enoxaprin 40mg  q24 hours for VTE prophylaxis.   Filed Weights   09/29/16 1812 09/29/16 1817  Weight: 117 lb (53.1 kg) 117 lb (53.1 kg)    Body mass index is 22.85 kg/m.  Estimated Creatinine Clearance: 23.2 mL/min (by C-G formula based on SCr of 1.04 mg/dL (H)).  Based on [facility] patient is candidate for enoxaparin 30mg  every 24 hours based on CrCl <93ml/min.  DESCRIPTION: Pharmacy has adjusted enoxaparin dose per [facility] policy.  Patient is now receiving enoxaparin 30mg  every 24 hours.  Nancy Fetter, PharmD Clinical Pharmacist  10/01/2016 8:39 AM

## 2016-10-01 NOTE — Progress Notes (Signed)
Physical Therapy Treatment Patient Details Name: ROIZA WIEDEL MRN: [id number] DOB: 1919/09/01 Today's Date: 10/01/2016    History of Present Illness presented to ER secondary to worsening R hip/groin pain after injury 3 weeks prior, noted with R femoral neck fracture; now status post R ORIF (10/01/16), PWB.    PT Comments    Patient with noted improvement in functional mobility, completing all activities with less physical assist from therapist this date.  Demonstrating gait distance at least 76' with RW, min assist, adequate for household ambulation upon discharge.  Does require consistent cuing for walker use/safety and for adherence to [facility] R LE, as patient with mild confusion/memory deficits (especially with new information). Given improvement in functional status, patient okay for discharge home with family (patient/family strongly preferring); do recommend use of RW, +1 sup/assist and 24/7 supervision at this time.   Follow Up Recommendations  Home health PT;Supervision/Assistance - 24 hour     Equipment Recommendations  Rolling walker with 5" wheels    Recommendations for Other Services       Precautions / Restrictions Precautions Precautions: Fall Restrictions Weight Bearing Restrictions: Yes RLE Weight Bearing: Partial weight bearing Other Position/Activity Restrictions: verbally confirmed by Dr. Marry Guan; RN to update/clarify orders to reflect    Mobility  Bed Mobility Overal bed mobility: Needs Assistance Bed Mobility: Supine to Sit     Supine to sit: Supervision     General bed mobility comments: able to mobilize to edge of bed without therapist assist this PM  Transfers Overall transfer level: Needs assistance Equipment used: Rolling walker (2 wheeled) Transfers: Sit to/from Stand Sit to Stand: Min assist         General transfer comment: constant cuing for hand position, walker use/management; limited carry-over between  trials  Ambulation/Gait Ambulation/Gait assistance: Min assist Ambulation Distance (Feet): 55 Feet Assistive device: Rolling walker (2 wheeled)       General Gait Details: step to gait pattern with improved adherence to PWB R LE; fair cadence/gait speed without overt buckling or LOB.  Improved balance, control and overall safety with gait efforts this PM   Stairs            Wheelchair Mobility    Modified Rankin (Stroke Patients Only)       Balance Overall balance assessment: Needs assistance Sitting-balance support: No upper extremity supported;Feet supported Sitting balance-Leahy Scale: Good     Standing balance support: Bilateral upper extremity supported Standing balance-Leahy Scale: Fair                      Cognition Arousal/Alertness: Awake/alert Behavior During Therapy: WFL for tasks assessed/performed Overall Cognitive Status: Within Functional Limits for tasks assessed (mildly confused at times)                      Exercises Other Exercises Other Exercises: Toilet transfer, SPT with RW, min assist.  constant cuing for RW position and use, as patient tends to step away from/forget to use RW at times.  Sit/stand from [facility] with RW, min assist, with cuing for hand placement.    General Comments        Pertinent Vitals/Pain Pain Assessment: Faces Faces Pain Scale: Hurts a little bit Pain Location: R hip - "doesn't hurt unless I move it" Pain Descriptors / Indicators: Aching;Grimacing;Guarding Pain Intervention(s): Limited activity within patient's tolerance;Monitored during session;Repositioned    Home Living Family/patient expects to be discharged to:: Private residence Living Arrangements: Children Available Help  at Discharge: Family;Available 24 hours/day Type of Home: House Home Access: Level entry   Home Layout: Laundry or work area in basement;Able to live on main level with bedroom/bathroom        Prior Function Level of  Independence: Independent      Comments: Indep with ADLs, household and community mobility without assist device; has been using RW for past 2-3 weeks due to worsening R hip pain   PT Goals (current goals can now be found in the care plan section) Acute Rehab PT Goals Patient Stated Goal: to go back home with my daughter PT Goal Formulation: With patient Time For Goal Achievement: 10/15/16 Potential to Achieve Goals: Good Progress towards PT goals: Progressing toward goals    Frequency    BID      PT Plan Discharge plan needs to be updated    Co-evaluation             End of Session Equipment Utilized During Treatment: Gait belt Activity Tolerance: Patient tolerated treatment well Patient left: in chair;with call bell/phone within reach;with chair alarm set;with family/visitor present     Time: 2947-6546 PT Time Calculation (min) (ACUTE ONLY): 26 min  Charges:  $Gait Training: 8-22 mins $Therapeutic Exercise: 8-22 mins $Therapeutic Activity: 8-22 mins                    G Codes:      Ceniyah Thorp H. Owens Shark, PT, DPT, NCS 10/01/16, 3:47 PM [phone]

## 2016-10-01 NOTE — Brief Op Note (Signed)
09/29/2016 - 10/01/2016  12:54 AM  PATIENT:  Jody Mcclain  82 y.o. female  PRE-OPERATIVE DIAGNOSIS:  right femoral neck fracture  POST-OPERATIVE DIAGNOSIS:  right femoral neck fracture  PROCEDURE:  Procedure(s): CANNULATED HIP PINNING (Right)  SURGEON:  Surgeon(s) and Role:    * Dereck Leep, MD - Primary  ASSISTANTS: none   ANESTHESIA:   spinal  EBL:  Total I/O In: 500 [I.V.:500] Out: 300 [Urine:150; Blood:150]  BLOOD ADMINISTERED:none  DRAINS: none   LOCAL MEDICATIONS USED:  NONE  SPECIMEN:  No Specimen  DISPOSITION OF SPECIMEN:  N/A  COUNTS:  YES  TOURNIQUET:  * No tourniquets in log *  DICTATION: .Dragon Dictation  PLAN OF CARE: Admit to inpatient   PATIENT DISPOSITION:  PACU - hemodynamically stable.   Delay start of Pharmacological VTE agent (>24hrs) due to surgical blood loss or risk of bleeding: yes

## 2016-10-01 NOTE — Evaluation (Signed)
Physical Therapy Evaluation Patient Details Name: MARKAN CAZAREZ MRN: [id number] DOB: 1919-08-10 Today's Date: 10/01/2016   History of Present Illness  presented to ER secondary to worsening R hip/groin pain after injury 3 weeks prior, noted with R femoral neck fracture; now status post R ORIF (10/01/16), PWB.  Clinical Impression  Upon evaluation, patient alert and oriented to basic information (mildly confused to new information); follows simple commands.  R LE generally guarded due to pain, but tolerates all functional activities without significant difficulty.  Able to complete bed mobility with mod assist; sit/stand, basic transfers and gait (5') with RW, min/mod assist.  Constant cuing for PWB R LE, stepping pattern/sequence and dynamic standing balance (to prevent posterior LOB). Unsafe to complete without RW and +1 assist at this time. Would benefit from skilled PT to address above deficits and promote optimal return to PLOF; recommend transition to STR upon discharge from acute hospitalization.     Follow Up Recommendations SNF    Equipment Recommendations  Rolling walker with 5" wheels    Recommendations for Other Services       Precautions / Restrictions Precautions Precautions: Fall Restrictions Weight Bearing Restrictions: Yes RLE Weight Bearing: Partial weight bearing Other Position/Activity Restrictions: verbally confirmed by Dr. Marry Guan; RN to update/clarify orders to reflect      Mobility  Bed Mobility Overal bed mobility: Needs Assistance Bed Mobility: Supine to Sit     Supine to sit: Mod assist     General bed mobility comments: assist for LE management and truncal elevation  Transfers Overall transfer level: Needs assistance Equipment used: Rolling walker (2 wheeled) Transfers: Sit to/from Stand Sit to Stand: Mod assist         General transfer comment: cuing for hand/foot placement, WBing status; excessive posterior weight shift requiring  min/mod assist to prevent posterior LOB  Ambulation/Gait Ambulation/Gait assistance: Min assist;Mod assist Ambulation Distance (Feet): 5 Feet Assistive device: Rolling walker (2 wheeled)       General Gait Details: step to gait pattern with constant cuing for PWB R LE; step by step cuing for gait sequence; constant assist for weight shift/balance to prevent posterior LOB  Stairs            Wheelchair Mobility    Modified Rankin (Stroke Patients Only)       Balance Overall balance assessment: Needs assistance Sitting-balance support: Feet supported;No upper extremity supported Sitting balance-Leahy Scale: Good     Standing balance support: Bilateral upper extremity supported Standing balance-Leahy Scale: Fair                               Pertinent Vitals/Pain Pain Assessment: Faces Faces Pain Scale: Hurts little more Pain Location: R hip Pain Descriptors / Indicators: Aching;Grimacing;Guarding Pain Intervention(s): Limited activity within patient's tolerance;Monitored during session;Repositioned    Home Living Family/patient expects to be discharged to:: Private residence Living Arrangements: Children Available Help at Discharge: Family;Available 24 hours/day Type of Home: House Home Access: Level entry     Home Layout: Laundry or work area in basement;Able to live on main level with bedroom/bathroom        Prior Function Level of Independence: Independent         Comments: Indep with ADLs, household and community mobility without assist device; has been using RW for past 2-3 weeks due to worsening R hip pain     Hand Dominance        Extremity/Trunk Assessment  Upper Extremity Assessment: Overall WFL for tasks assessed           Lower Extremity Assessment: Overall WFL for tasks assessed (grossly 3-/5 throughout R hip, limited by pain; denies sensory deficit)         Communication   Communication: No difficulties   Cognition Arousal/Alertness: Awake/alert Behavior During Therapy: WFL for tasks assessed/performed Overall Cognitive Status: Within Functional Limits for tasks assessed (mildly confused at times)                      General Comments      Exercises Other Exercises Other Exercises: Supine LE therex, 1x10, AROM for muscular strength/endurance with functional activities: ankle pumps, SAQs, heel slides, hip abduct/adduct.  Act assist at times due to pain.   Assessment/Plan    PT Assessment Patient needs continued PT services  PT Problem List Decreased strength;Decreased range of motion;Decreased activity tolerance;Decreased balance;Decreased mobility;Decreased cognition;Decreased knowledge of use of DME;Decreased safety awareness;Decreased knowledge of precautions;Pain;Decreased skin integrity          PT Treatment Interventions DME instruction;Gait training;Functional mobility training;Therapeutic activities;Therapeutic exercise;Balance training;Neuromuscular re-education;Patient/family education    PT Goals (Current goals can be found in the Care Plan section)  Acute Rehab PT Goals Patient Stated Goal: to go back home with my daughter PT Goal Formulation: With patient Time For Goal Achievement: 10/15/16 Potential to Achieve Goals: Good    Frequency BID   Barriers to discharge        Co-evaluation               End of Session Equipment Utilized During Treatment: Gait belt Activity Tolerance: Patient tolerated treatment well Patient left: in chair;with call bell/phone within reach;with chair alarm set           Time: 1012-1038 PT Time Calculation (min) (ACUTE ONLY): 26 min   Charges:   PT Evaluation $PT Eval Low Complexity: 1 Procedure PT Treatments $Therapeutic Exercise: 8-22 mins   PT G Codes:        Kean Gautreau H. Owens Shark, PT, DPT, NCS 10/01/16, 2:44 PM [phone]

## 2016-10-01 NOTE — Care Management Note (Addendum)
Case Management Note  Patient Details  Name: Jody Mcclain MRN: [id number] Date of Birth: 1919-02-01  Subjective/Objective:   TC to patient daughter. She is planning on taking her mother home. Patient lives with this daughter Jody Mcclain, [phone]). Prior to admission. Offered choice of home health agency, [facility] notified that patient may be discharged tomorrow. Ordered rolling walker from [facility].                   Action/Plan: Requested attending put in home health orders and rolling walker order today.    Expected Discharge Date:  10/03/16               Expected Discharge Plan:  [place]  In-House Referral:     Discharge planning Services  CM Consult  Post Acute Care Choice:  Home Health Choice offered to:  Adult Children  DME Arranged:  Walker rolling DME Agency:  [place] Arranged:  PT [place] Agency:  [place]  Status of Service:  In process, will continue to follow  If discussed at Long Length of Stay Meetings, dates discussed:    Additional Comments:  Jolly Mango, RN 10/01/2016, 11:20 AM

## 2016-10-01 NOTE — Progress Notes (Signed)
   Subjective: 1 Day Post-Op Procedure(s) (LRB): CANNULATED HIP PINNING (Right) Patient reports pain as mild.   Patient is well, and has had no acute complaints or problems We will start therapy today.  Plan is to go Rehab after hospital stay. no nausea and no vomiting Patient denies any chest pains or shortness of breath. Objective: Vital signs in last 24 hours: Temp:  [97 F (36.1 C)-98.2 F (36.8 C)] 98.2 F (36.8 C) (11/22 0807) Pulse Rate:  [59-73] 67 (11/22 0932) Resp:  [14-26] 14 (11/22 0807) BP: (101-161)/(46-66) 108/58 (11/22 0932) SpO2:  [91 %-100 %] 96 % (11/22 0807) well approximated incision Heels are non tender and elevated off the bed using rolled towels Intake/Output from previous day: 11/21 0701 - 11/22 0700 In: 1781.3 [P.O.:220; I.V.:1311.3; IV Piggyback:250] Out: 900 [Urine:750; Blood:150] Intake/Output this shift: Total I/O In: -  Out: 200 [Urine:200]   Recent Labs  09/29/16 1848 10/01/16 0435  HGB 14.2 12.8    Recent Labs  09/29/16 1848 10/01/16 0435  WBC 4.0 4.0  RBC 4.62 4.04  HCT 41.1 36.2  PLT 228 174    Recent Labs  09/29/16 1848 10/01/16 0435  NA 135 138  K 4.1 3.8  CL 100* 106  CO2 26 26  BUN 24* 17  CREATININE 1.25* 1.04*  GLUCOSE 103* 89  CALCIUM 9.4 8.5*    Recent Labs  09/29/16 1848  INR 1.02    EXAM General - Patient is Alert, Appropriate and Oriented Extremity - Neurologically intact Neurovascular intact Sensation intact distally Intact pulses distally Dorsiflexion/Plantar flexion intact No cellulitis present Dressing - dressing C/D/I Motor Function - intact, moving foot and toes well on exam.    Past Medical History:  Diagnosis Date  . Arthritis    back and joints  . 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 Chronic constipation   . CKD (chronic kidney disease), stage III    pt has been referred to  nephrologist by PCP, appt. is later this month July 2016  . Full dentures   . Generalized weakness    bilateral legs  . GERD (gastroesophageal reflux disease)   . Hematuria   . History of hiatal hernia   . Hyperlipidemia   . Hypertension   . Hypothyroidism   . Lower extremity edema   . Malaise and fatigue   . OAB (overactive bladder)   . Renal neoplasm   . SUI (stress urinary incontinence, female)   . Tricuspid valve regurgitation, nonrheumatic cardiologist-- dr Saralyn Pilar   severe per cardiology note    Assessment/Plan: 1 Day Post-Op Procedure(s) (LRB): CANNULATED HIP PINNING (Right) Active Problems:   Closed right hip fracture, initial encounter ([place])  Estimated body mass index is 22.85 kg/m as calculated from the following:   Height as of this encounter: 5' (1.524 m).   Weight as of this encounter: 53.1 kg (117 lb). Advance diet Up with therapy D/C IV fluids Plan for discharge tomorrow Discharge to SNF  Labs: reviewed DVT Prophylaxis - Lovenox, Foot Pumps and TED hose Weight-Bearing as tolerated to right leg D/C O2 and Pulse OX and try on Room Air Begin working on a bowel movement Labs in am [employer]. [facility] 10/01/2016, 11:34 AM

## 2016-10-01 NOTE — Progress Notes (Signed)
PT is recommending SNF. Clinical Education officer, museum (CSW) met with patient and her granddaughter was at bedside. CSW discussed SNF option. Patient and granddaughter adamantly refused SNF. Per granddaughter patient has 24/7 care and will go home with home health. Granddaughter chose [place]. RN case manager aware of above. RN aware of above. Please reconsult if future social work needs arise. CSW signing off.   [employer], LCSW [phone]

## 2016-10-01 NOTE — Care Management Important Message (Signed)
Important Message  Patient Details  Name: Jody Mcclain MRN: [id number] Date of Birth: September 23, 1919   Medicare Important Message Given:  Yes    Jolly Mango, RN 10/01/2016, 11:13 AM

## 2016-10-01 NOTE — Op Note (Addendum)
OPERATIVE NOTE  DATE OF SURGERY:  09/30/2016  PATIENT NAME:  Jody Mcclain   DOB: 02/10/1919  MRN: [id number]   PRE-OPERATIVE DIAGNOSIS: Right femoral neck fracture  POST-OPERATIVE DIAGNOSIS:  Same  PROCEDURE:  Percutaneous pinning of a right femoral neck fracture  SURGEON:  Marciano Sequin. M.D.  ANESTHESIA: spinal  ESTIMATED BLOOD LOSS: 10 mL  FLUIDS REPLACED: 500 mL of crystalloid  DRAINS: None  IMPLANTS UTILIZED: Synthes 7.3 mm cannulated partially threaded cancellous screws x3  INDICATIONS FOR SURGERY: [name] is a 82 y.o. year old female who sustained a right femoral neck fracture. After discussion of the risks and benefits of surgical intervention, the patient expressed understanding of the risks benefits and agree with plans for percutaneous pinning of the femoral neck fracture.   PROCEDURE IN DETAIL: The patient was brought into the operating room and, after adequate spinal anesthesia was achieved, the patient was positioned on the fracture table. The right lower extremity was positioned in traction and the contralateral leg was positioned in the well leg holder. All bony prominences were well-padded. The patient's right hip and leg were cleaned and prepped with alcohol and DuraPrep and draped in the usual sterile fashion. A "timeout" was performed as per usual protocol. A stab incision was made along the lateral aspect of the left hip and a distally threaded guidepin was provisionally placed along the lateral cortex of the femur. Position was confirmed both AP and lateral planes. The guidewire was then advanced into the femoral neck and head in anticipation of placement of the inferior screw. Again, position was confirmed both AP and lateral planes. The parallel wire guide was placed over the initial guidewire and a distally threaded guidewire was inserted through the superior/anterior position. Position was confirmed in both AP and lateral planes using the C-arm.  Finally, a third distally threaded guidewire was inserted through the superior/posterior position. Final position of the guide pins was confirmed in both AP and lateral planes using the C-arm. Measurements were obtained and three Synthes 7.3 mm partially threaded cannulated cancellus screws were advanced over the guidewires. Placement and position were confirmed in both AP and lateral planes using the C-arm. The guidewires were removed. The wound was irrigated with copious amounts of normal saline with antibiotic solution. Subcutaneous tissue was approximated in layers using first #0 Vicryl followed by #2-0 Vicryl. Skin was closed with skin staples. A sterile dressing was applied.  The patient tolerated the procedure well. She was transported to the recovery room in stable condition.  Evian Derringer P. Holley Bouche M.D.

## 2016-10-02 LAB — BASIC METABOLIC PANEL
Anion gap: 7 (ref 5–15)
BUN: 17 mg/dL (ref 6–20)
CHLORIDE: 105 mmol/L (ref 101–111)
CO2: 25 mmol/L (ref 22–32)
CREATININE: 1.03 mg/dL — AB (ref 0.44–1.00)
Calcium: 8.5 mg/dL — ABNORMAL LOW (ref 8.9–10.3)
GFR calc Af Amer: 52 mL/min — ABNORMAL LOW (ref >60)
GFR calc non Af Amer: 45 mL/min — ABNORMAL LOW (ref >60)
GLUCOSE: 98 mg/dL (ref 65–99)
POTASSIUM: 3.8 mmol/L (ref 3.5–5.1)
Sodium: 137 mmol/L (ref 135–145)

## 2016-10-02 LAB — CBC
HCT: 35.6 % (ref 35.0–47.0)
HEMOGLOBIN: 12.4 g/dL (ref 12.0–16.0)
MCH: 31.3 pg (ref 26.0–34.0)
MCHC: 34.9 g/dL (ref 32.0–36.0)
MCV: 89.5 fL (ref 80.0–100.0)
PLATELETS: 164 10*3/uL (ref 150–440)
RBC: 3.98 MIL/uL (ref 3.80–5.20)
RDW: 13.1 % (ref 11.5–14.5)
WBC: 4.3 10*3/uL (ref 3.6–11.0)

## 2016-10-02 MED ORDER — TRAMADOL HCL 50 MG PO TABS: 50.0000 mg | ORAL_TABLET | Freq: Four times a day (QID) | ORAL | 0 refills | Status: DC | PRN

## 2016-10-02 NOTE — Care Management Note (Signed)
Case Management Note  Patient Details  Name: EDWENA MAYORGA MRN: [id number] Date of Birth: 03/13/1919  Subjective/Objective:     Please see previous NCM notes.               Action/Plan: Discharge Planning: AVS reviewed:  Chart reviewed. Contacted [facility] to make aware of dc home today with HH.   PCP  Glendon Axe MD   Expected Discharge Date:  10/02/2016              Expected Discharge Plan:  [place]  In-House Referral:  NA  Discharge planning Services  CM Consult  Post Acute Care Choice:  Home Health Choice offered to:  Adult Children  DME Arranged:  Walker rolling DME Agency:  [place] Arranged:  PT [place] Agency:  [place]  Status of Service:  Completed, signed off  If discussed at [place] of Stay Meetings, dates discussed:    Additional Comments:  Erenest Rasher, RN 10/02/2016, 11:42 AM

## 2016-10-02 NOTE — Plan of Care (Signed)
Problem: Pain Management: Goal: Pain level will decrease Outcome: Completed/Met Date Met: 10/02/16 Pt has met all goals for discharge.

## 2016-10-02 NOTE — Progress Notes (Signed)
Shift assessment completed at 0800.pt is alert and oriented, on room air, in no distress, rating pain at 0/10. Lungs are clear bilat, HR is regular, abdomen is soft, bs heard. PIV #20 intact to l fa with site free of redness and swelling. Pt is oob to commode to void with assisst, ppp, honeycomb dressing intact to r hip area.  Since assessment completed, Dr.'s have rounded on pt and pt was cleared for discharge. PIV removed with catheter intact by this Probation officer and pt's family notified.this Probation officer called tp's family to notify them of pt d/c. Pt was then toileted and assissted with dressing by staff, pt was dc'd at approx 12:30 via wc to entrance and wating family car. Pt was given script for tramadol and rest of d/c paperwork, son in law aware of need for follow up appts and pain medication.

## 2016-10-02 NOTE — Discharge Summary (Signed)
[facility] [place] NAME: Jody Mcclain    MR#:  [id number]  DATE OF BIRTH:  [birth year]  DATE OF ADMISSION:  09/29/2016 ADMITTING PHYSICIAN: Lytle Butte, MD  DATE OF DISCHARGE: 10/02/2016  PRIMARY CARE PHYSICIAN: Singh,Jasmine, MD    ADMISSION DIAGNOSIS:  Closed fracture of right hip, initial encounter ([place]) [S72.001A]  DISCHARGE DIAGNOSIS:  Active Problems:   Closed right hip fracture, initial encounter ([place])   SECONDARY DIAGNOSIS:   Past Medical History:  Diagnosis Date  . Arthritis    back and joints  . 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 Chronic constipation   . CKD (chronic kidney disease), stage III    pt has been referred to nephrologist by PCP, appt. is later this month July 2016  . Full dentures   . Generalized weakness    bilateral legs  . GERD (gastroesophageal reflux disease)   . Hematuria   . History of hiatal hernia   . Hyperlipidemia   . Hypertension   . Hypothyroidism   . Lower extremity edema   . Malaise and fatigue   . OAB (overactive bladder)   . Renal neoplasm   . SUI (stress urinary incontinence, female)   . Tricuspid valve regurgitation, nonrheumatic cardiologist-- dr Saralyn Pilar   severe per cardiology note    HOSPITAL COURSE:   82 year old female with past medical history of hypothyroidism, hypertension, hyperlipidemia, history of hiatal hernia, GERD, chronic kidney disease stage III, history of bladder cancer who presented to the hospital after mechanical fall and noted to have a right hip fracture.  1. Right hip fracture-This was secondary to mechanical fall. -Patient was seen by orthopedics and is status post percutaneous pinning. She is postop day #2. Her pain was well-controlled with oral tramadol. She was seen by physical therapy who recommended home health services and she is being  discharged with that now.   2. Essential hypertension- she will continue Norvasc, metoprolol.  3. Hypothyroidism- she will continue Synthroid.  4. Chronic kidney disease stage III-patient's creatinine is close to baseline. - no acute issue related to this while in the hospital.   5. HyperLipidemia-she will continue Pravachol.  6. GERD-she will continue Protonix.  DISCHARGE CONDITIONS:   Stable  CONSULTS OBTAINED:  Treatment Team:  Lytle Butte, MD Dereck Leep, MD  DRUG ALLERGIES:   Allergies  Allergen Reactions  . Augmentin [Amoxicillin-Pot Clavulanate] Nausea Only  . Boniva [Ibandronic Acid] Other (See Comments)    unknown  . Ceftin [Cefuroxime] Other (See Comments)  . Fosamax [Alendronate] Other (See Comments)    unknown  . Lipitor [Atorvastatin] Other (See Comments)    MUSCLE PAIN  . Sulfa Antibiotics Nausea Only  . Thiazide-Type Diuretics Other (See Comments)    unknown    DISCHARGE MEDICATIONS:     Medication List    TAKE these medications   amLODipine 10 MG tablet Commonly known as:  NORVASC Take 10 mg by mouth every morning.   aspirin EC 81 MG tablet Take 81 mg by mouth daily.   B-12 1000 MCG/ML Kit Inject as directed every 30 (thirty) days.   ferrous sulfate 325 (65 FE) MG tablet TAKE 1 TABLET (325 MG TOTAL) BY MOUTH DAILY WITH BREAKFAST.   levothyroxine 50 MCG tablet Commonly known as:  SYNTHROID, LEVOTHROID Take 50 mcg by mouth daily before breakfast.   lovastatin 40 MG tablet  Commonly known as:  MEVACOR Take 40 mg by mouth every morning.   metoprolol tartrate 25 MG tablet Commonly known as:  LOPRESSOR Take 25 mg by mouth daily.   omeprazole 40 MG capsule Commonly known as:  PRILOSEC Take 40 mg by mouth every morning.   traMADol 50 MG tablet Commonly known as:  ULTRAM Take 1 tablet (50 mg total) by mouth every 6 (six) hours as needed for moderate pain.   Vitamin D 2000 units Caps Take 1 capsule by mouth daily.   ZETIA  10 MG tablet Generic drug:  ezetimibe Take 10 mg by mouth daily.            Durable Medical Equipment        Start     Ordered   10/01/16 1325  For home use only DME Walker rolling  Once    Question:  Patient needs a walker to treat with the following condition  Answer:  Hip fracture ([place])   10/01/16 1324        DISCHARGE INSTRUCTIONS:   DIET:  Cardiac diet  DISCHARGE CONDITION:  Stable  ACTIVITY:  Activity as tolerated  OXYGEN:  Home Oxygen: No.   Oxygen Delivery: room air  DISCHARGE LOCATION:  Home with Home Health PT   If you experience worsening of your admission symptoms, develop shortness of breath, life threatening emergency, suicidal or homicidal thoughts you must seek medical attention immediately by calling 911 or calling your MD immediately  if symptoms less severe.  You Must read complete instructions/literature along with all the possible adverse reactions/side effects for all the Medicines you take and that have been prescribed to you. Take any new Medicines after you have completely understood and accpet all the possible adverse reactions/side effects.   Please note  You were cared for by a hospitalist during your hospital stay. If you have any questions about your discharge medications or the care you received while you were in the hospital after you are discharged, you can call the unit and asked to speak with the hospitalist on call if the hospitalist that took care of you is not available. Once you are discharged, your primary care physician will handle any further medical issues. Please note that NO REFILLS for any discharge medications will be authorized once you are discharged, as it is imperative that you return to your primary care physician (or establish a relationship with a primary care physician if you do not have one) for your aftercare needs so that they can reassess your need for medications and monitor your lab values.     Today    No acute events overnight.  No pain. Tolerating PT well.   VITAL SIGNS:  Blood pressure 107/88, pulse 64, temperature 98.9 F (37.2 C), temperature source Oral, resp. rate 18, height 5' (1.524 m), weight 53.1 kg (117 lb), SpO2 95 %.  I/O:   Intake/Output Summary (Last 24 hours) at 10/02/16 1256 Last data filed at 10/02/16 0400  Gross per 24 hour  Intake             1790 ml  Output              400 ml  Net             1390 ml    PHYSICAL EXAMINATION:  GENERAL:  82 y.o.-year-old patient lying in the bed with no acute distress.  EYES: Pupils equal, round, reactive to light and accommodation. No scleral icterus. Extraocular muscles  intact.  HEENT: Head atraumatic, normocephalic. Oropharynx and nasopharynx clear.  NECK:  Supple, no jugular venous distention. No thyroid enlargement, no tenderness.  LUNGS: Normal breath sounds bilaterally, no wheezing, rales,rhonchi. No use of accessory muscles of respiration.  CARDIOVASCULAR: S1, S2 normal. No murmurs, rubs, or gallops.  ABDOMEN: Soft, non-tender, non-distended. Bowel sounds present. No organomegaly or mass.  EXTREMITIES: No pedal edema, cyanosis, or clubbing. Right hip dressing in place.   NEUROLOGIC: Cranial nerves II through XII are intact. No focal motor or sensory defecits b/l.  PSYCHIATRIC: The patient is alert and oriented x 3. Good affect.  SKIN: No obvious rash, lesion, or ulcer.   DATA REVIEW:   CBC  Recent Labs Lab 10/02/16 0625  WBC 4.3  HGB 12.4  HCT 35.6  PLT 164    Chemistries   Recent Labs Lab 10/02/16 0625  NA 137  K 3.8  CL 105  CO2 25  GLUCOSE 98  BUN 17  CREATININE 1.03*  CALCIUM 8.5*    Cardiac Enzymes No results for input(s): TROPONINI in the last 168 hours.  Microbiology Results  Results for orders placed or performed during the hospital encounter of 09/29/16  Surgical PCR screen     Status: None   Collection Time: 09/29/16 11:12 PM  Result Value Ref Range Status   MRSA, PCR  NEGATIVE NEGATIVE Final   Staphylococcus aureus NEGATIVE NEGATIVE Final    Comment:        The Xpert SA Assay (FDA approved for NASAL specimens in patients over 38 years of age), is one component of a comprehensive surveillance program.  Test performance has been validated by [facility] for patients greater than or equal to 36 year old. It is not intended to diagnose infection nor to guide or monitor treatment.     RADIOLOGY:  Dg C-arm 1-60 Min  Result Date: 10/01/2016 CLINICAL DATA:  Internal fixation of right femoral fracture. Initial encounter. EXAM: OPERATIVE RIGHT HIP (WITH PELVIS IF PERFORMED)  VIEWS TECHNIQUE: Fluoroscopic spot image(s) were submitted for interpretation post-operatively. COMPARISON:  CT of the right hip performed 09/29/2016 FINDINGS: Pins are noted transfixing the patient's subcapital right femoral neck fracture in grossly anatomic alignment. The right femoral head remains seated at the acetabulum. No new fractures are seen. IMPRESSION: Status post internal fixation of subcapital right femoral neck fracture in grossly anatomic alignment. Electronically Signed   By: Garald Balding M.D.   On: 10/01/2016 02:08   Dg Hip Operative Unilat W Or W/o Pelvis Right  Result Date: 10/01/2016 CLINICAL DATA:  Internal fixation of right femoral fracture. Initial encounter. EXAM: OPERATIVE RIGHT HIP (WITH PELVIS IF PERFORMED)  VIEWS TECHNIQUE: Fluoroscopic spot image(s) were submitted for interpretation post-operatively. COMPARISON:  CT of the right hip performed 09/29/2016 FINDINGS: Pins are noted transfixing the patient's subcapital right femoral neck fracture in grossly anatomic alignment. The right femoral head remains seated at the acetabulum. No new fractures are seen. IMPRESSION: Status post internal fixation of subcapital right femoral neck fracture in grossly anatomic alignment. Electronically Signed   By: Garald Balding M.D.   On: 10/01/2016 02:08      Management  plans discussed with the patient, family and they are in agreement.  CODE STATUS:     Code Status Orders        Start     Ordered   09/29/16 1944  Full code  Continuous     09/29/16 1943    Code Status History    Date Active Date  Inactive Code Status Order ID Comments User Context   This patient has a current code status but no historical code status.      TOTAL TIME TAKING CARE OF THIS PATIENT: 40 minutes.    Henreitta Leber M.D on 10/02/2016 at 12:56 PM  Between 7am to 6pm - Pager - [phone]  After 6pm go to [email or web address] - Proofreader  [facility]  Office  [phone]  CC: Primary care physician; Glendon Axe, MD

## 2016-10-02 NOTE — Progress Notes (Signed)
Subjective: 2 Days Post-Op Procedure(s) (LRB): CANNULATED HIP PINNING (Right) Patient reports pain as mild.   Patient is well, and has had no acute complaints or problems We will start therapy today.  Plan is to go Rehab after hospital stay. no nausea and no vomiting Patient denies any chest pains or shortness of breath. Objective: Vital signs in last 24 hours: Temp:  [97.8 F (36.6 C)-98.9 F (37.2 C)] 98.9 F (37.2 C) (11/23 0725) Pulse Rate:  [59-70] 64 (11/23 0725) Resp:  [16-19] 18 (11/23 0725) BP: (103-137)/(45-88) 107/88 (11/23 0725) SpO2:  [90 %-96 %] 95 % (11/23 0725) well approximated incision Heels are non tender and elevated off the bed using rolled towels Intake/Output from previous day: 11/22 0701 - 11/23 0700 In: 7035 [P.O.:240; I.V.:1550] Out: 600 [Urine:600] Intake/Output this shift: No intake/output data recorded.   Recent Labs  09/29/16 1848 10/01/16 0435 10/02/16 0625  HGB 14.2 12.8 12.4    Recent Labs  10/01/16 0435 10/02/16 0625  WBC 4.0 4.3  RBC 4.04 3.98  HCT 36.2 35.6  PLT 174 164    Recent Labs  10/01/16 0435 10/02/16 0625  NA 138 137  K 3.8 3.8  CL 106 105  CO2 26 25  BUN 17 17  CREATININE 1.04* 1.03*  GLUCOSE 89 98  CALCIUM 8.5* 8.5*    Recent Labs  09/29/16 1848  INR 1.02    EXAM General - Patient is Alert, Appropriate and Oriented Extremity - Neurologically intact Neurovascular intact Sensation intact distally Intact pulses distally Dorsiflexion/Plantar flexion intact No cellulitis present Dressing - dressing C/D/I Motor Function - intact, moving foot and toes well on exam.    Past Medical History:  Diagnosis Date  . Arthritis    back and joints  . 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 Chronic constipation   . CKD (chronic kidney disease), stage III    pt has been referred to nephrologist by PCP, appt. is  later this month July 2016  . Full dentures   . Generalized weakness    bilateral legs  . GERD (gastroesophageal reflux disease)   . Hematuria   . History of hiatal hernia   . Hyperlipidemia   . Hypertension   . Hypothyroidism   . Lower extremity edema   . Malaise and fatigue   . OAB (overactive bladder)   . Renal neoplasm   . SUI (stress urinary incontinence, female)   . Tricuspid valve regurgitation, nonrheumatic cardiologist-- dr Saralyn Pilar   severe per cardiology note    Assessment/Plan: 2 Days Post-Op Procedure(s) (LRB): CANNULATED HIP PINNING (Right) Active Problems:   Closed right hip fracture, initial encounter ([place])  Estimated body mass index is 22.85 kg/m as calculated from the following:   Height as of this encounter: 5' (1.524 m).   Weight as of this encounter: 53.1 kg (117 lb). Up with therapy  Labs: reviewed DVT Prophylaxis - Lovenox, Foot Pumps and TED hose Weight-Bearing as tolerated to right leg D/C O2 and Pulse OX and try on Room Air Begin working on a bowel movement, reports she has had one however don't see one documented. Pt would like to be discharged home with her granddaughter, plan will be for discharge when medically stable.  Continue Lovenox 30mg  daily x 14 days. Follow-up with either Vance Peper PA-C or Dr. Marry Guan in 10-14 days for staple removal with [facility].  Raquel James, PA-C  [place] 10/02/2016, 8:46 AM

## 2016-10-09 ENCOUNTER — Encounter: Payer: Self-pay | Admitting: Orthopedic Surgery

## 2016-10-09 NOTE — Anesthesia Postprocedure Evaluation (Signed)
Anesthesia Post Note  Patient: Jody Mcclain  Procedure(s) Performed: Procedure(s) (LRB): CANNULATED HIP PINNING (Right)  Patient location during evaluation: PACU Anesthesia Type: General Level of consciousness: awake and alert Pain management: pain level controlled Vital Signs Assessment: post-procedure vital signs reviewed and stable Respiratory status: spontaneous breathing, nonlabored ventilation, respiratory function stable and patient connected to nasal cannula oxygen Cardiovascular status: blood pressure returned to baseline and stable Postop Assessment: no signs of nausea or vomiting Anesthetic complications: no    Last Vitals:  Vitals:   10/02/16 0423 10/02/16 0725  BP: (!) 137/54 107/88  Pulse: 70 64  Resp: 19 18  Temp: 37.1 C 37.2 C    Last Pain:  Vitals:   10/02/16 0725  TempSrc: Oral  PainSc:                  Molli Barrows

## 2017-11-23 ENCOUNTER — Other Ambulatory Visit: Payer: Self-pay | Admitting: Orthopedic Surgery

## 2017-11-23 DIAGNOSIS — M5416 Radiculopathy, lumbar region: Secondary | ICD-10-CM

## 2017-11-30 ENCOUNTER — Ambulatory Visit
Admission: RE | Admit: 2017-11-30 | Discharge: 2017-11-30 | Disposition: A | Discharge status: Home / Self Care | Payer: Medicare Other | Source: Ambulatory Visit | Attending: Orthopedic Surgery | Admitting: Orthopedic Surgery

## 2017-11-30 DIAGNOSIS — M5416 Radiculopathy, lumbar region: Secondary | ICD-10-CM | POA: Diagnosis present

## 2017-11-30 DIAGNOSIS — M47816 Spondylosis without myelopathy or radiculopathy, lumbar region: Secondary | ICD-10-CM | POA: Diagnosis not present

## 2018-11-26 ENCOUNTER — Other Ambulatory Visit: Payer: Self-pay

## 2018-11-26 ENCOUNTER — Ambulatory Visit
Admission: EM | Admit: 2018-11-26 | Discharge: 2018-11-26 | Disposition: A | Discharge status: Home / Self Care | Payer: Medicare Other | Attending: Family Medicine | Admitting: Family Medicine

## 2018-11-26 DIAGNOSIS — H6121 Impacted cerumen, right ear: Secondary | ICD-10-CM | POA: Diagnosis not present

## 2018-11-26 NOTE — ED Triage Notes (Signed)
Patient states that she has bilateral ear wax build up that has been ongoing. Patient states that primary MD told her to come here because they don't flush ears any longer.

## 2018-11-26 NOTE — ED Provider Notes (Signed)
[facility]    CSN: [id number] Arrival date & time: 11/26/18  1121  History   Chief Complaint Chief Complaint  Patient presents with  . Cerumen Impaction   HPI  85 year old female presents with the above complaint.  Patient states that her right ear has been clogged for the past week.  She feels like it is full of wax.  Associated pain and difficulty hearing out of that ear.  She states that she is attempted to get it out herself but has not been able to do so.  Patient contacted her primary care provider and she was told that they no longer do ear lavages in the clinic.  She was sent over for this today.  She otherwise feels well.  No other associated symptoms.  No other complaints.  PMH, Surgical Hx, Family Hx, Social History reviewed and updated as below.  Past Medical History:  Diagnosis Date  . Arthritis    back and joints  . 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 Chronic constipation   . CKD (chronic kidney disease), stage III ([place])    pt has been referred to nephrologist by PCP, appt. is later this month July 2016  . Full dentures   . Generalized weakness    bilateral legs  . GERD (gastroesophageal reflux disease)   . Hematuria   . History of hiatal hernia   . Hyperlipidemia   . Hypertension   . Hypothyroidism   . Lower extremity edema   . Malaise and fatigue   . OAB (overactive bladder)   . Renal neoplasm   . SUI (stress urinary incontinence, female)   . Tricuspid valve regurgitation, nonrheumatic cardiologist-- dr Saralyn Pilar   severe per cardiology note    Patient Active Problem List   Diagnosis Date Noted  . Closed right hip fracture, initial encounter ([place]) 09/29/2016    Past Surgical History:  Procedure Laterality Date  . CATARACT EXTRACTION W/ INTRAOCULAR LENS  IMPLANT, BILATERAL  2001  . CYSTOSCOPY WITH BIOPSY N/A 07/06/2015   Procedure:  CYSTOSCOPY WITH BLADDER BIOPSY;  Surgeon: Kathie Rhodes, MD;  [facility];  Service: Urology;  Laterality: N/A;  . HIP PINNING,CANNULATED Right 09/30/2016   Procedure: CANNULATED HIP PINNING;  Surgeon: Dereck Leep, MD;  Location: [facility];  Service: Orthopedics;  Laterality: Right;  . TRANSTHORACIC ECHOCARDIOGRAM  04-06-2013   dr Sheppard Coil paraschos   mild to moderate LVH,  ef 60%/  moderate AI , valve area 2.6cm^2/  moderate to severe TI/  mild MI  . TRANSURETHRAL RESECTION OF BLADDER TUMOR WITH GYRUS (TURBT-GYRUS) N/A 05/18/2015   Procedure: TRANSURETHRAL RESECTION OF BLADDER TUMOR WITH GYRUS (TURBT-GYRUS);  Surgeon: Kathie Rhodes, MD;  Location[facility];  Service: Urology;  Laterality: N/A;  . VAGINAL HYSTERECTOMY  1969    OB History   No obstetric history on file.      Home Medications    Prior to Admission medications   Medication Sig Start Date End Date Taking? Authorizing Provider  amLODipine (NORVASC) 10 MG tablet Take 10 mg by mouth every morning.    Yes [provider]  aspirin EC 81 MG tablet Take 81 mg by mouth daily.   Yes [provider]  Cholecalciferol (VITAMIN D) 2000 units CAPS Take 1 capsule by mouth daily.   Yes [provider]  Cyanocobalamin (B-12) 1000 MCG/ML KIT Inject as directed  every 30 (thirty) days.   Yes [provider]  levothyroxine (SYNTHROID, LEVOTHROID) 50 MCG tablet Take 50 mcg by mouth daily before breakfast.   Yes [provider]  lovastatin (MEVACOR) 40 MG tablet Take 40 mg by mouth every morning.    Yes [provider]  omeprazole (PRILOSEC) 40 MG capsule Take 40 mg by mouth every morning.    Yes [provider]  ZETIA 10 MG tablet Take 10 mg by mouth daily. 06/23/15  Yes [provider]    Family History Family History  Problem Relation Age of Onset  . Hypertension Other     Social History Social History   Tobacco Use  .  Smoking status: Former Smoker    Packs/day: 2.00    Years: 20.00    Pack years: 40.00    Types: Cigarettes    Last attempt to quit: 05/15/1968    Years since quitting: 50.5  . Smokeless tobacco: Never Used  Substance Use Topics  . Alcohol use: No  . Drug use: No     Allergies   Augmentin [amoxicillin-pot clavulanate]; Boniva [ibandronic acid]; Ceftin [cefuroxime]; Fosamax [alendronate]; Lipitor [atorvastatin]; Sulfa antibiotics; and Thiazide-type diuretics   Review of Systems Review of Systems  Constitutional: Negative.   HENT: Positive for ear pain and hearing loss.    Physical Exam Triage Vital Signs ED Triage Vitals  Enc Vitals Group     BP 11/26/18 1134 (!) 141/71     Pulse Rate 11/26/18 1134 69     Resp 11/26/18 1134 17     Temp 11/26/18 1134 97.8 F (36.6 C)     Temp Source 11/26/18 1134 Oral     SpO2 11/26/18 1134 98 %     Weight 11/26/18 1132 111 lb (50.3 kg)     Height 11/26/18 1132 _0  (1.549 m)     Head Circumference --      Peak Flow --      Pain Score 11/26/18 1132 0     Pain Loc --      Pain Edu? --      Excl. in [place]? --    Updated Vital Signs BP (!) 141/71 (BP Location: Right Arm)   Pulse 69   Temp 97.8 F (36.6 C) (Oral)   Resp 17   Ht _1  (1.549 m)   Wt 50.3 kg   SpO2 98%   BMI 20.97 kg/m   Visual Acuity Right Eye Distance:   Left Eye Distance:   Bilateral Distance:    Right Eye Near:   Left Eye Near:    Bilateral Near:     Physical Exam Vitals signs and nursing note reviewed.  Constitutional:      General: She is not in acute distress. HENT:     Head: Normocephalic and atraumatic.     Ears:     Comments: Left canal and TM normal.  Right ear -TM obscured by cerumen.    Nose: Nose normal.  Eyes:     General:        Right eye: No discharge.        Left eye: No discharge.     Conjunctiva/sclera: Conjunctivae normal.  Cardiovascular:     Rate and Rhythm: Normal rate and regular rhythm.  Pulmonary:     Effort: Pulmonary  effort is normal.     Breath sounds: No wheezing, rhonchi or rales.  Neurological:     Mental Status: She is alert.  Psychiatric:  Mood and Affect: Mood normal.        Behavior: Behavior normal.    UC Treatments / Results  Labs (all labs ordered are listed, but only abnormal results are displayed) Labs Reviewed - No data to display  EKG None  Radiology No results found.  Procedures Procedures (including critical care time)  Medications Ordered in UC Medications - No data to display  Initial Impression / Assessment and Plan / UC Course  I have reviewed the triage vital signs and the nursing notes.  Pertinent labs & imaging results that were available during my care of the patient were reviewed by me and considered in my medical decision making (see chart for details).    85 year old female presents with cerumen impaction.  Attempts to remove were unsuccessful today.  Arranging for patient to see ENT.  Appointment on Monday.  Final Clinical Impressions(s) / UC Diagnoses   Final diagnoses:  Impacted cerumen of right ear     Discharge Instructions     Call [facility]. [phone]  Take care  Dr. Lacinda Axon     ED Prescriptions    None     Controlled Substance Prescriptions [place] Controlled Substance Registry consulted? Not Applicable   Coral Spikes, DO 11/26/18 1322

## 2018-11-26 NOTE — Discharge Instructions (Signed)
Call [facility]. [phone]  Take care  Dr. Lacinda Axon

## 2020-03-13 ENCOUNTER — Inpatient Hospital Stay
Admission: EM | Admit: 2020-03-13 | Discharge: 2020-04-10 | DRG: 177 | Disposition: E | Payer: Medicare Other | Attending: Internal Medicine | Admitting: Internal Medicine

## 2020-03-13 ENCOUNTER — Emergency Department: Payer: Medicare Other

## 2020-03-13 ENCOUNTER — Other Ambulatory Visit: Payer: Self-pay

## 2020-03-13 DIAGNOSIS — J1282 Pneumonia due to coronavirus disease 2019: Secondary | ICD-10-CM | POA: Diagnosis present

## 2020-03-13 DIAGNOSIS — U071 COVID-19: Secondary | ICD-10-CM | POA: Diagnosis present

## 2020-03-13 DIAGNOSIS — I482 Chronic atrial fibrillation, unspecified: Secondary | ICD-10-CM | POA: Diagnosis present

## 2020-03-13 DIAGNOSIS — E039 Hypothyroidism, unspecified: Secondary | ICD-10-CM | POA: Diagnosis present

## 2020-03-13 DIAGNOSIS — Z66 Do not resuscitate: Secondary | ICD-10-CM | POA: Diagnosis present

## 2020-03-13 DIAGNOSIS — Z90710 Acquired absence of both cervix and uterus: Secondary | ICD-10-CM

## 2020-03-13 DIAGNOSIS — N170 Acute kidney failure with tubular necrosis: Secondary | ICD-10-CM | POA: Diagnosis present

## 2020-03-13 DIAGNOSIS — J9601 Acute respiratory failure with hypoxia: Secondary | ICD-10-CM

## 2020-03-13 DIAGNOSIS — R0602 Shortness of breath: Secondary | ICD-10-CM

## 2020-03-13 DIAGNOSIS — K219 Gastro-esophageal reflux disease without esophagitis: Secondary | ICD-10-CM | POA: Diagnosis present

## 2020-03-13 DIAGNOSIS — E873 Alkalosis: Secondary | ICD-10-CM | POA: Diagnosis present

## 2020-03-13 DIAGNOSIS — Z515 Encounter for palliative care: Secondary | ICD-10-CM | POA: Diagnosis not present

## 2020-03-13 DIAGNOSIS — Z79899 Other long term (current) drug therapy: Secondary | ICD-10-CM | POA: Diagnosis not present

## 2020-03-13 DIAGNOSIS — E871 Hypo-osmolality and hyponatremia: Secondary | ICD-10-CM | POA: Diagnosis present

## 2020-03-13 DIAGNOSIS — Z79890 Hormone replacement therapy (postmenopausal): Secondary | ICD-10-CM

## 2020-03-13 DIAGNOSIS — D72819 Decreased white blood cell count, unspecified: Secondary | ICD-10-CM | POA: Diagnosis present

## 2020-03-13 DIAGNOSIS — Z87891 Personal history of nicotine dependence: Secondary | ICD-10-CM | POA: Diagnosis not present

## 2020-03-13 DIAGNOSIS — Z7189 Other specified counseling: Secondary | ICD-10-CM | POA: Diagnosis not present

## 2020-03-13 DIAGNOSIS — I129 Hypertensive chronic kidney disease with stage 1 through stage 4 chronic kidney disease, or unspecified chronic kidney disease: Secondary | ICD-10-CM | POA: Diagnosis present

## 2020-03-13 DIAGNOSIS — Z7901 Long term (current) use of anticoagulants: Secondary | ICD-10-CM | POA: Diagnosis not present

## 2020-03-13 DIAGNOSIS — Z7982 Long term (current) use of aspirin: Secondary | ICD-10-CM

## 2020-03-13 DIAGNOSIS — E46 Unspecified protein-calorie malnutrition: Secondary | ICD-10-CM | POA: Diagnosis present

## 2020-03-13 DIAGNOSIS — R54 Age-related physical debility: Secondary | ICD-10-CM | POA: Diagnosis present

## 2020-03-13 DIAGNOSIS — J9621 Acute and chronic respiratory failure with hypoxia: Secondary | ICD-10-CM | POA: Diagnosis present

## 2020-03-13 DIAGNOSIS — R042 Hemoptysis: Secondary | ICD-10-CM | POA: Diagnosis present

## 2020-03-13 DIAGNOSIS — Z8551 Personal history of malignant neoplasm of bladder: Secondary | ICD-10-CM

## 2020-03-13 DIAGNOSIS — I10 Essential (primary) hypertension: Secondary | ICD-10-CM | POA: Diagnosis present

## 2020-03-13 DIAGNOSIS — E785 Hyperlipidemia, unspecified: Secondary | ICD-10-CM | POA: Diagnosis present

## 2020-03-13 DIAGNOSIS — N184 Chronic kidney disease, stage 4 (severe): Secondary | ICD-10-CM | POA: Diagnosis present

## 2020-03-13 DIAGNOSIS — N3281 Overactive bladder: Secondary | ICD-10-CM | POA: Diagnosis present

## 2020-03-13 LAB — COMPREHENSIVE METABOLIC PANEL
ALT: 8 U/L (ref 0–44)
AST: 26 U/L (ref 15–41)
Albumin: 3.1 g/dL — ABNORMAL LOW (ref 3.5–5.0)
Alkaline Phosphatase: 68 U/L (ref 38–126)
Anion gap: 10 (ref 5–15)
BUN: 23 mg/dL (ref 8–23)
CO2: 22 mmol/L (ref 22–32)
Calcium: 8.2 mg/dL — ABNORMAL LOW (ref 8.9–10.3)
Chloride: 92 mmol/L — ABNORMAL LOW (ref 98–111)
Creatinine, Ser: 1.34 mg/dL — ABNORMAL HIGH (ref 0.44–1.00)
GFR calc Af Amer: 38 mL/min — ABNORMAL LOW (ref >60)
GFR calc non Af Amer: 33 mL/min — ABNORMAL LOW (ref >60)
Glucose, Bld: 112 mg/dL — ABNORMAL HIGH (ref 70–99)
Potassium: 4.1 mmol/L (ref 3.5–5.1)
Sodium: 124 mmol/L — ABNORMAL LOW (ref 135–145)
Total Bilirubin: 1 mg/dL (ref 0.3–1.2)
Total Protein: 6.6 g/dL (ref 6.5–8.1)

## 2020-03-13 LAB — LACTATE DEHYDROGENASE: LDH: 280 U/L — ABNORMAL HIGH (ref 98–192)

## 2020-03-13 LAB — CBC WITH DIFFERENTIAL/PLATELET
Abs Immature Granulocytes: 0.03 10*3/uL (ref 0.00–0.07)
Basophils Absolute: 0 10*3/uL (ref 0.0–0.1)
Basophils Relative: 0 %
Eosinophils Absolute: 0 10*3/uL (ref 0.0–0.5)
Eosinophils Relative: 0 %
HCT: 40.3 % (ref 36.0–46.0)
Hemoglobin: 13.6 g/dL (ref 12.0–15.0)
Immature Granulocytes: 1 %
Lymphocytes Relative: 2 %
Lymphs Abs: 0.1 10*3/uL — ABNORMAL LOW (ref 0.7–4.0)
MCH: 26.5 pg (ref 26.0–34.0)
MCHC: 33.7 g/dL (ref 30.0–36.0)
MCV: 78.6 fL — ABNORMAL LOW (ref 80.0–100.0)
Monocytes Absolute: 0.2 10*3/uL (ref 0.1–1.0)
Monocytes Relative: 3 %
Neutro Abs: 5.8 10*3/uL (ref 1.7–7.7)
Neutrophils Relative %: 94 %
Platelets: 241 10*3/uL (ref 150–400)
RBC: 5.13 MIL/uL — ABNORMAL HIGH (ref 3.87–5.11)
RDW: 18.8 % — ABNORMAL HIGH (ref 11.5–15.5)
WBC: 6.1 10*3/uL (ref 4.0–10.5)
nRBC: 0 % (ref 0.0–0.2)

## 2020-03-13 LAB — TRIGLYCERIDES: Triglycerides: 94 mg/dL (ref <150)

## 2020-03-13 LAB — ABO/RH: ABO/RH(D): O POS

## 2020-03-13 LAB — TROPONIN I (HIGH SENSITIVITY)
Troponin I (High Sensitivity): 19 ng/L — ABNORMAL HIGH (ref <18)
Troponin I (High Sensitivity): 18 ng/L — ABNORMAL HIGH (ref <18)

## 2020-03-13 LAB — FIBRINOGEN: Fibrinogen: 682 mg/dL — ABNORMAL HIGH (ref 210–475)

## 2020-03-13 LAB — LACTIC ACID, PLASMA
Lactic Acid, Venous: 2.8 mmol/L (ref 0.5–1.9)
Lactic Acid, Venous: 2.2 mmol/L (ref 0.5–1.9)

## 2020-03-13 LAB — PROCALCITONIN: Procalcitonin: 0.17 ng/mL

## 2020-03-13 LAB — C-REACTIVE PROTEIN: CRP: 14.1 mg/dL — ABNORMAL HIGH (ref <1.0)

## 2020-03-13 LAB — FERRITIN: Ferritin: 170 ng/mL (ref 11–307)

## 2020-03-13 LAB — FIBRIN DERIVATIVES D-DIMER (ARMC ONLY): Fibrin derivatives D-dimer (ARMC): 1427.32 ng/mL (FEU) — ABNORMAL HIGH (ref 0.00–499.00)

## 2020-03-13 LAB — BRAIN NATRIURETIC PEPTIDE: B Natriuretic Peptide: 384 pg/mL — ABNORMAL HIGH (ref 0.0–100.0)

## 2020-03-13 MED ORDER — ONDANSETRON HCL 4 MG PO TABS: 4.0000 mg | ORAL_TABLET | Freq: Four times a day (QID) | ORAL | Status: DC | PRN

## 2020-03-13 MED ORDER — PIPERACILLIN-TAZOBACTAM 3.375 G IVPB 30 MIN
3.3750 g | Freq: Once | INTRAVENOUS | Status: AC
  Administered 2020-03-13: 3.375 g via INTRAVENOUS
  Filled 2020-03-13: qty 50

## 2020-03-13 MED ORDER — SODIUM CHLORIDE 0.9 % IV SOLN
500.0000 mg | Freq: Once | INTRAVENOUS | Status: AC
  Administered 2020-03-13: 500 mg via INTRAVENOUS
  Filled 2020-03-13: qty 500

## 2020-03-13 MED ORDER — ACETAMINOPHEN 325 MG PO TABS
650.0000 mg | ORAL_TABLET | Freq: Four times a day (QID) | ORAL | Status: DC | PRN
  Administered 2020-03-14 – 2020-03-15 (×2): 650 mg via ORAL
  Filled 2020-03-13 (×2): qty 2

## 2020-03-13 MED ORDER — IOHEXOL 350 MG/ML SOLN
60.0000 mL | Freq: Once | INTRAVENOUS | Status: AC | PRN
  Administered 2020-03-13: 50 mL via INTRAVENOUS

## 2020-03-13 MED ORDER — LEVOTHYROXINE SODIUM 50 MCG PO TABS
50.0000 ug | ORAL_TABLET | Freq: Every day | ORAL | Status: DC
  Administered 2020-03-14 – 2020-03-16 (×3): 50 ug via ORAL
  Filled 2020-03-13 (×3): qty 1

## 2020-03-13 MED ORDER — ONDANSETRON HCL 4 MG/2ML IJ SOLN: 4.0000 mg | Freq: Four times a day (QID) | INTRAMUSCULAR | Status: DC | PRN

## 2020-03-13 MED ORDER — VITAMIN D 25 MCG (1000 UNIT) PO TABS
2000.0000 [IU] | ORAL_TABLET | Freq: Every day | ORAL | Status: DC
  Administered 2020-03-14 – 2020-03-15 (×2): 2000 [IU] via ORAL
  Filled 2020-03-13 (×2): qty 2

## 2020-03-13 MED ORDER — DEXAMETHASONE SODIUM PHOSPHATE 10 MG/ML IJ SOLN
6.0000 mg | INTRAMUSCULAR | Status: DC
  Administered 2020-03-14 – 2020-03-16 (×3): 6 mg via INTRAVENOUS
  Filled 2020-03-13 (×3): qty 0.6

## 2020-03-13 MED ORDER — ALBUTEROL SULFATE HFA 108 (90 BASE) MCG/ACT IN AERS
2.0000 | INHALATION_SPRAY | Freq: Four times a day (QID) | RESPIRATORY_TRACT | Status: DC
  Administered 2020-03-13 – 2020-03-16 (×10): 2 via RESPIRATORY_TRACT
  Filled 2020-03-13: qty 6.7

## 2020-03-13 MED ORDER — SODIUM CHLORIDE 0.9% FLUSH: 3.0000 mL | INTRAVENOUS | Status: DC | PRN

## 2020-03-13 MED ORDER — GUAIFENESIN-DM 100-10 MG/5ML PO SYRP
10.0000 mL | ORAL_SOLUTION | ORAL | Status: DC | PRN
  Administered 2020-03-14 – 2020-03-15 (×3): 10 mL via ORAL
  Filled 2020-03-13 (×5): qty 10

## 2020-03-13 MED ORDER — PANTOPRAZOLE SODIUM 40 MG PO TBEC
40.0000 mg | DELAYED_RELEASE_TABLET | Freq: Every day | ORAL | Status: DC
  Administered 2020-03-13 – 2020-03-15 (×3): 40 mg via ORAL
  Filled 2020-03-13 (×3): qty 1

## 2020-03-13 MED ORDER — SODIUM CHLORIDE 0.9% FLUSH
3.0000 mL | Freq: Two times a day (BID) | INTRAVENOUS | Status: DC
  Administered 2020-03-13 – 2020-03-16 (×7): 3 mL via INTRAVENOUS

## 2020-03-13 MED ORDER — SODIUM CHLORIDE 0.9 % IV SOLN
200.0000 mg | Freq: Once | INTRAVENOUS | Status: AC
  Administered 2020-03-13: 200 mg via INTRAVENOUS
  Filled 2020-03-13: qty 40

## 2020-03-13 MED ORDER — DEXAMETHASONE SODIUM PHOSPHATE 10 MG/ML IJ SOLN
6.0000 mg | Freq: Once | INTRAMUSCULAR | Status: AC
  Administered 2020-03-13: 6 mg via INTRAVENOUS
  Filled 2020-03-13: qty 1

## 2020-03-13 MED ORDER — SODIUM CHLORIDE 0.9 % IV BOLUS
500.0000 mL | Freq: Once | INTRAVENOUS | Status: AC
  Administered 2020-03-13: 500 mL via INTRAVENOUS

## 2020-03-13 MED ORDER — ASCORBIC ACID 500 MG PO TABS
500.0000 mg | ORAL_TABLET | Freq: Every day | ORAL | Status: DC
  Administered 2020-03-13 – 2020-03-15 (×3): 500 mg via ORAL
  Filled 2020-03-13 (×3): qty 1

## 2020-03-13 MED ORDER — METOPROLOL SUCCINATE ER 50 MG PO TB24
50.0000 mg | ORAL_TABLET | Freq: Every day | ORAL | Status: DC
  Administered 2020-03-14 – 2020-03-15 (×2): 50 mg via ORAL
  Filled 2020-03-13 (×3): qty 1

## 2020-03-13 MED ORDER — SODIUM CHLORIDE 0.9 % IV SOLN: 100.0000 mg | Freq: Every day | INTRAVENOUS | Status: DC

## 2020-03-13 MED ORDER — AMLODIPINE BESYLATE 5 MG PO TABS
5.0000 mg | ORAL_TABLET | Freq: Every day | ORAL | Status: DC
  Administered 2020-03-13 – 2020-03-15 (×3): 5 mg via ORAL
  Filled 2020-03-13 (×3): qty 1

## 2020-03-13 MED ORDER — APIXABAN 2.5 MG PO TABS
2.5000 mg | ORAL_TABLET | Freq: Two times a day (BID) | ORAL | Status: DC
  Administered 2020-03-13 – 2020-03-15 (×5): 2.5 mg via ORAL
  Filled 2020-03-13 (×6): qty 1

## 2020-03-13 MED ORDER — VANCOMYCIN HCL IN DEXTROSE 1-5 GM/200ML-% IV SOLN
1000.0000 mg | Freq: Once | INTRAVENOUS | Status: AC
  Administered 2020-03-13: 1000 mg via INTRAVENOUS
  Filled 2020-03-13: qty 200

## 2020-03-13 MED ORDER — ZINC SULFATE 220 (50 ZN) MG PO CAPS
220.0000 mg | ORAL_CAPSULE | Freq: Every day | ORAL | Status: DC
  Administered 2020-03-13 – 2020-03-15 (×3): 220 mg via ORAL
  Filled 2020-03-13 (×3): qty 1

## 2020-03-13 MED ORDER — SODIUM CHLORIDE 0.9 % IV SOLN: 250.0000 mL | INTRAVENOUS | Status: DC | PRN

## 2020-03-13 MED ORDER — SODIUM CHLORIDE 0.9 % IV SOLN
100.0000 mg | Freq: Every day | INTRAVENOUS | Status: DC
  Administered 2020-03-14 – 2020-03-16 (×3): 100 mg via INTRAVENOUS
  Filled 2020-03-13: qty 20
  Filled 2020-03-13: qty 100
  Filled 2020-03-13 (×3): qty 20

## 2020-03-13 MED ORDER — CALCIUM CARBONATE-VITAMIN D 500-200 MG-UNIT PO TABS
2.0000 | ORAL_TABLET | Freq: Every day | ORAL | Status: DC
  Administered 2020-03-14 – 2020-03-15 (×2): 2 via ORAL
  Filled 2020-03-13 (×2): qty 2

## 2020-03-13 MED ORDER — SODIUM CHLORIDE 0.9 % IV SOLN: 200.0000 mg | Freq: Once | INTRAVENOUS | Status: DC

## 2020-03-13 MED ORDER — ALBUTEROL SULFATE HFA 108 (90 BASE) MCG/ACT IN AERS: 1.0000 | INHALATION_SPRAY | Freq: Four times a day (QID) | RESPIRATORY_TRACT | Status: DC | PRN

## 2020-03-13 NOTE — ED Provider Notes (Signed)
[facility] Emergency Department Provider Note  ____________________________________________   First MD Initiated Contact with Patient 03/24/2020 1021     (approximate)  I have reviewed the triage vital signs and the nursing notes.   HISTORY  Chief Complaint Shortness of Breath    HPI Jody Mcclain is a 86 y.o. female with hypertension, CKD, prior bladder cancer who comes in with evaluation for low oxygen levels.  Patient was Covid positive on 4/21.  Patient having worsening shortness of breath.  Patient satting 84-86% on room air and was placed on oxygen.  Per patient she had been doing well from Covid.  Has seen on the hospital.  Been on steroids and antibiotics.  However she started feeling worsening shortness of breath over the past few days, constant, severe, nothing makes it better, worse with exertion.  No chest pain.  No bowel abdominal pain, no leg swelling.  Denies any prior history of lung issues.          Past Medical History:  Diagnosis Date  . Arthritis    back and joints  . 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 Chronic constipation   . CKD (chronic kidney disease), stage III    pt has been referred to nephrologist by PCP, appt. is later this month July 2016  . Full dentures   . Generalized weakness    bilateral legs  . GERD (gastroesophageal reflux disease)   . Hematuria   . History of hiatal hernia   . Hyperlipidemia   . Hypertension   . Hypothyroidism   . Lower extremity edema   . Malaise and fatigue   . OAB (overactive bladder)   . Renal neoplasm   . SUI (stress urinary incontinence, female)   . Tricuspid valve regurgitation, nonrheumatic cardiologist-- dr Saralyn Pilar   severe per cardiology note    Patient Active Problem List   Diagnosis Date Noted  . Closed right hip fracture, initial encounter ([place]) 09/29/2016    Past  Surgical History:  Procedure Laterality Date  . CATARACT EXTRACTION W/ INTRAOCULAR LENS  IMPLANT, BILATERAL  2001  . CYSTOSCOPY WITH BIOPSY N/A 07/06/2015   Procedure: CYSTOSCOPY WITH BLADDER BIOPSY;  Surgeon: Kathie Rhodes, MD;  Location: [facility];  Service: Urology;  Laterality: N/A;  . HIP PINNING,CANNULATED Right 09/30/2016   Procedure: CANNULATED HIP PINNING;  Surgeon: Dereck Leep, MD;  Location: [facility];  Service: Orthopedics;  Laterality: Right;  . TRANSTHORACIC ECHOCARDIOGRAM  04-06-2013   dr Sheppard Coil paraschos   mild to moderate LVH,  ef 60%/  moderate AI , valve area 2.6cm^2/  moderate to severe TI/  mild MI  . TRANSURETHRAL RESECTION OF BLADDER TUMOR WITH GYRUS (TURBT-GYRUS) N/A 05/18/2015   Procedure: TRANSURETHRAL RESECTION OF BLADDER TUMOR WITH GYRUS (TURBT-GYRUS);  Surgeon: Kathie Rhodes, MD;  Location: [facility];  Service: Urology;  Laterality: N/A;  . [place]    Prior to Admission medications   Medication Sig Start Date End Date Taking? Authorizing Provider  amLODipine (NORVASC) 10 MG tablet Take 10 mg by mouth every morning.     [provider]  aspirin EC 81 MG tablet Take 81 mg by mouth daily.    [provider]  Cholecalciferol (VITAMIN D) 2000 units CAPS Take 1 capsule by mouth daily.    [provider]  Cyanocobalamin (B-12) 1000 MCG/ML KIT Inject  as directed every 30 (thirty) days.    [provider]  levothyroxine (SYNTHROID, LEVOTHROID) 50 MCG tablet Take 50 mcg by mouth daily before breakfast.    [provider]  lovastatin (MEVACOR) 40 MG tablet Take 40 mg by mouth every morning.     [provider]  omeprazole (PRILOSEC) 40 MG capsule Take 40 mg by mouth every morning.     [provider]  ZETIA 10 MG tablet Take 10 mg by mouth daily. 06/23/15   [provider]    Allergies Augmentin [amoxicillin-pot clavulanate], Boniva [ibandronic  acid], Ceftin [cefuroxime], Fosamax [alendronate], Lipitor [atorvastatin], Sulfa antibiotics, and Thiazide-type diuretics  Family History  Problem Relation Age of Onset  . Hypertension Other     Social History Social History   Tobacco Use  . Smoking status: Former Smoker    Packs/day: 2.00    Years: 20.00    Pack years: 40.00    Types: Cigarettes    Quit date: 05/15/1968    Years since quitting: 51.8  . Smokeless tobacco: Never Used  Substance Use Topics  . Alcohol use: No  . Drug use: No      Review of Systems Constitutional: No fever/chills Eyes: No visual changes. ENT: No sore throat. Cardiovascular: No chest pain Respiratory: Positive for SOB Gastrointestinal: No abdominal pain.  No nausea, no vomiting.  No diarrhea.  No constipation. Genitourinary: Negative for dysuria. Musculoskeletal: Negative for back pain. Skin: Negative for rash. Neurological: Negative for headaches, focal weakness or numbness. All other ROS negative ____________________________________________   PHYSICAL EXAM:  VITAL SIGNS: ED Triage Vitals  Enc Vitals Group     BP 03/19/2020 1024 (!) 128/92     Pulse Rate 03/26/2020 1024 (!) 44     Resp 03/18/2020 1024 (!) 22     Temp 04/05/2020 1024 97.7 F (36.5 C)     Temp Source 03/18/2020 1024 Oral     SpO2 03/19/2020 1024 (!) 87 %     Weight 03/16/2020 1026 110 lb (49.9 kg)     Height 03/28/2020 1026 5' (1.524 m)     Head Circumference --      Peak Flow --      Pain Score --      Pain Loc --      Pain Edu? --      Excl. in [place]? --     Constitutional: Alert and oriented. Well appearing and in no acute distress. Eyes: Conjunctivae are normal. EOMI. Head: Atraumatic. Nose: No congestion/rhinnorhea. Mouth/Throat: Mucous membranes are moist.   Neck: No stridor. Trachea Midline. FROM Cardiovascular: Normal rate, regular rhythm. Grossly normal heart sounds.  Good peripheral circulation. Respiratory: No wheezing noted, no increased work of breathing but  patient is on 5 L Gastrointestinal: Soft and nontender. No distention. No abdominal bruits.  Musculoskeletal: No lower extremity tenderness nor edema.  No joint effusions. Neurologic:  Normal speech and language. No gross focal neurologic deficits are appreciated.  Skin:  Skin is warm, dry and intact. No rash noted. Psychiatric: Mood and affect are normal. Speech and behavior are normal. GU: Deferred   ____________________________________________   LABS (all labs ordered are listed, but only abnormal results are displayed)  Labs Reviewed  LACTIC ACID, PLASMA - Abnormal; Notable for the following components:      Result Value   Lactic Acid, Venous 2.8 (*)    All other components within normal limits  LACTIC ACID, PLASMA - Abnormal; Notable for the following components:   Lactic  Acid, Venous 2.2 (*)    All other components within normal limits  CBC WITH DIFFERENTIAL/PLATELET - Abnormal; Notable for the following components:   RBC 5.13 (*)    MCV 78.6 (*)    RDW 18.8 (*)    Lymphs Abs 0.1 (*)    All other components within normal limits  COMPREHENSIVE METABOLIC PANEL - Abnormal; Notable for the following components:   Sodium 124 (*)    Chloride 92 (*)    Glucose, Bld 112 (*)    Creatinine, Ser 1.34 (*)    Calcium 8.2 (*)    Albumin 3.1 (*)    GFR calc non Af Amer 33 (*)    GFR calc Af Amer 38 (*)    All other components within normal limits  FIBRIN DERIVATIVES D-DIMER ([facility] ONLY) - Abnormal; Notable for the following components:   Fibrin derivatives D-dimer ([facility]) 1,427.32 (*)    All other components within normal limits  LACTATE DEHYDROGENASE - Abnormal; Notable for the following components:   LDH 280 (*)    All other components within normal limits  FIBRINOGEN - Abnormal; Notable for the following components:   Fibrinogen 682 (*)    All other components within normal limits  BRAIN NATRIURETIC PEPTIDE - Abnormal; Notable for the following components:   B Natriuretic  Peptide 384.0 (*)    All other components within normal limits  TROPONIN I (HIGH SENSITIVITY) - Abnormal; Notable for the following components:   Troponin I (High Sensitivity) 19 (*)    All other components within normal limits  TROPONIN I (HIGH SENSITIVITY) - Abnormal; Notable for the following components:   Troponin I (High Sensitivity) 18 (*)    All other components within normal limits  CULTURE, BLOOD (ROUTINE X 2)  CULTURE, BLOOD (ROUTINE X 2)  MRSA PCR SCREENING  PROCALCITONIN  FERRITIN  TRIGLYCERIDES  C-REACTIVE PROTEIN   ____________________________________________   ED ECG REPORT I, Vanessa [place], the attending physician, personally viewed and interpreted this ECG.  EKG shows atrial fibrillation rate of 114 no ST elevation, some T wave inversions in 3 aVF V3 to V6 ____________________________________________  RADIOLOGY I, Vanessa [place], personally viewed and evaluated these images (plain radiographs) as part of my medical decision making, as well as reviewing the written report by the radiologist.  ED MD interpretation: Pulmonary calcifications bilaterally  Official radiology report(s): CT Angio Chest PE W and/or Wo Contrast  Result Date: 04/02/2020 CLINICAL DATA:  Shortness of breath.  Recent COVID-19 positive EXAM: CT ANGIOGRAPHY CHEST WITH CONTRAST TECHNIQUE: Multidetector CT imaging of the chest was performed using the standard protocol during bolus administration of intravenous contrast. Multiplanar CT image reconstructions and MIPs were obtained to evaluate the vascular anatomy. CONTRAST:  73m OMNIPAQUE IOHEXOL 350 MG/ML SOLN COMPARISON:  Chest radiograph Mar 13, 2020 FINDINGS: Cardiovascular: There is no demonstrable pulmonary embolus. There is no appreciable thoracic aortic aneurysm. No dissection is seen; note that the contrast bolus in the aorta is less than optimal for potential dissection assessment. Visualized great vessels show scattered foci of atherosclerotic  calcification. There is aortic atherosclerosis. There are multiple foci of coronary artery calcification. There is no pericardial effusion or pericardial thickening. Heart is mildly enlarged in a generalized manner. There is prominence of the main pulmonary outflow tract measuring 3.4 cm. Mediastinum/Nodes: Thyroid appears unremarkable. There is no appreciable thoracic adenopathy. There is a large paraesophageal hernia with much of the stomach above the diaphragm. Lungs/Pleura: There is underlying centrilobular emphysematous change. There is bullous  disease in the left lower lobe. There is fibrosis throughout the lower lobe regions with traction bronchiectatic change in each lower lobe. There is a somewhat mosaic attenuation throughout the lungs. There is airspace opacity in the right lower lobe consistent with a degree of pneumonia. Other areas of somewhat ill-defined opacity are superimposed on apparent fibrosis. Upper Abdomen: There is upper abdominal aortic atherosclerosis. There is calcification in multiple visualized upper abdominal mesenteric arterial vessels. Visualized upper abdominal structures otherwise appear unremarkable. Musculoskeletal: There is anterior wedging of the T1 vertebral body. There are no blastic or lytic bone lesions. No chest wall lesions evident. Review of the MIP images confirms the above findings. IMPRESSION: 1. No demonstrable pulmonary embolus. No thoracic aortic aneurysm. No dissection is with note that the contrast bolus within the aorta is not sufficient for confident dissection assessment. There are foci of aortic atherosclerosis as well as foci of great vessel and coronary artery calcification. Mild cardiac enlargement in a generalized manner. 2. Underlying centrilobular emphysematous change. Fibrosis in the lung bases with traction bronchiectasis likely represents a degree of superimposed usual interstitial pneumonitis. There is airspace opacity consistent with a degree of  pneumonia in the right lower lobe. There are mosaic areas of attenuation throughout the lungs in part due to redistribution of blood flow to viable lung segments. There is probable degree also of small airways obstructive disease. There may be superimposed pneumonia in some of these areas as well. No consolidation evident. Atypical organism pneumonia is felt to be likely in this circumstance. 3. Sizable paraesophageal hernia with much of the stomach above the diaphragm. 4.  No evident adenopathy. 5. Prominence of the main pulmonary outflow tract is felt to be indicative of a degree of pulmonary arterial hypertension. Aortic Atherosclerosis (ICD10-I70.0) and Emphysema (ICD10-J43.9). Electronically Signed   By: Lowella Grip III M.D.   On: 03/22/2020 13:33   DG Chest Port 1 View  Result Date: 04/04/2020 CLINICAL DATA:  86 year old female positive for COVID-19. 3 weeks ago. Continued shortness of breath with activity. EXAM: PORTABLE CHEST 1 VIEW COMPARISON:  Portable chest 09/29/2016 and earlier. FINDINGS: Stable cardiomegaly and mediastinal contours. Stable large lung volumes. Coarse and indistinct widespread right lung and left perihilar and lower lung opacity is new since 2017. No superimposed pneumothorax or definite pleural effusion. Visualized tracheal air column is within normal limits. No acute osseous abnormality identified. Negative visible bowel gas pattern. IMPRESSION: Widespread right greater than left indistinct and interstitial pulmonary opacity compatible with COVID-19 Pneumonia in this setting. No pleural effusion. Electronically Signed   By: Genevie Ann M.D.   On: 03/30/2020 11:07    ____________________________________________   PROCEDURES  Procedure(s) performed (including Critical Care):  .Critical Care Performed by: Vanessa [place], MD Authorized by: Vanessa [place], MD   Critical care provider statement:    Critical care time (minutes):  45   Critical care was necessary to treat or  prevent imminent or life-threatening deterioration of the following conditions:  Respiratory failure   Critical care was time spent personally by me on the following activities:  Discussions with consultants, evaluation of patient's response to treatment, examination of patient, ordering and performing treatments and interventions, ordering and review of laboratory studies, ordering and review of radiographic studies, pulse oximetry, re-evaluation of patient's condition, obtaining history from patient or surrogate and review of old charts     ____________________________________________   INITIAL IMPRESSION / ASSESSMENT AND PLAN / ED COURSE   Jody Mcclain was evaluated in Emergency  Department on 03/28/2020 for the symptoms described in the history of present illness. She was evaluated in the context of the global COVID-19 pandemic, which necessitated consideration that the patient might be at risk for infection with the SARS-CoV-2 virus that causes COVID-19. Institutional protocols and algorithms that pertain to the evaluation of patients at risk for COVID-19 are in a state of rapid change based on information released by regulatory bodies including the CDC and federal and state organizations. These policies and algorithms were followed during the patient's care in the ED.     Pt presents with SOB.  Patient looks well on 5 L but is also concerning for new A. fib on her EKG.  We will keep patient on the cardiac monitor.  We will get a CT PE given the prolonged course to make sure no evidence of pulmonary embolism. PNA-will get xray to evaluation Anemia-CBC to evaluate ACS- will get trops Arrhythmia-Will get EKG and keep on monitor.  COVID- will get testing per algorithm.   CT PE was negative.  Concerns for viral versus bacterial pneumonia.  Will start some antibiotics.  Patient only given a little bit of fluid given she is otherwise hemodynamically stable, heart rates have come down.  Do not  want to fluid overload given her Covid status.  Patient remains on 5 L.  Discussed with patient with daughter at bedside and patient is DNR.  Patient has capacity to make this decision.  She would want other interventions such as high flow nasal cannula although if necessary.  Discussed the hospital team for admission, will give Decadron and remdesivir  ____________________________________________   FINAL CLINICAL IMPRESSION(S) / ED DIAGNOSES   Final diagnoses:  Acute respiratory failure with hypoxia ([place])  Pneumonia due to COVID-19 virus     MEDICATIONS GIVEN DURING THIS VISIT:  Medications  vancomycin (VANCOCIN) IVPB 1000 mg/200 mL premix (has no administration in time range)  azithromycin (ZITHROMAX) 500 mg in sodium chloride 0.9 % 250 mL IVPB (has no administration in time range)  piperacillin-tazobactam (ZOSYN) IVPB 3.375 g (has no administration in time range)  dexamethasone (DECADRON) injection 6 mg (has no administration in time range)  remdesivir 200 mg in sodium chloride 0.9% 250 mL IVPB (has no administration in time range)    Followed by  remdesivir 100 mg in sodium chloride 0.9 % 100 mL IVPB (has no administration in time range)  sodium chloride 0.9 % bolus 500 mL (0 mLs Intravenous Stopped 04/04/2020 1412)  iohexol (OMNIPAQUE) 350 MG/ML injection 60 mL (50 mLs Intravenous Contrast Given 03/24/2020 1319)     ED Discharge Orders    None       Note:  This document was prepared using Dragon voice recognition software and may include unintentional dictation errors.   Vanessa [place], MD 03/25/2020 [phone]

## 2020-03-13 NOTE — ED Notes (Signed)
Date and time results received: 03/16/2020 1230  Test: Lactic  Critical Value: 2.8  Name of Provider Notified: Jari Pigg, MD

## 2020-03-13 NOTE — ED Notes (Signed)
Pt in room. RN assisted pt to bathroom and back. Pt is on bed, now with both side rails up. Pt is alert and oriented. Pt is on cardiac, bp and pulse ox monitoring. Pt states coming in due to sob and covid diagnosis late last month.

## 2020-03-13 NOTE — Significant Event (Deleted)
Reported that daughter Albertha Ghee was allowed to stay with patient in ED even though patient was covid positive. Reported to me comment was made by Albertha Ghee stating "I am coming in even if I have to shoot my way in". ED charge RN said she would contact security about situation.

## 2020-03-13 NOTE — Progress Notes (Signed)
Updated grand daughter Jody Mcclain [phone]. Pt is resting comfortably no s/s of distress.

## 2020-03-13 NOTE — H&P (Addendum)
History and Physical    Jody Mcclain BSW:[id number] DOB: [birth year] DOA: 03/15/2020  PCP: Glendon Axe, MD   Patient coming from: Home  I have personally briefly reviewed patient's old medical records in [place]  Chief Complaint: Shortness of breath  HPI: Jody Mcclain is a 86 y.o. female with medical history significant for hypertension, stage 4 chronic kidney disease and history of bladder cancer who was sent to the emergency room from [facility] clinic for evaluation of hypoxia.  Patient tested positive for Covid on 02/29/20.  She only had initial presentation and denied having any fever or chills.  Her primary care provider had called in antibiotics for her as well as steroids which she took as an outpatient.    3 days prior to her admission she developed generalized weakness, fatigue, shortness of breath and cough productive of thick white phlegm. She was seen by her primary care provider 3 days ago and received IV fluids in the office without any significant improvement in her weakness. She went back for a follow up with her primary care provider and was noted to be hypoxic with pulse oximetry of 86% on room air.  She was placed on oxygen and sent to the emergency room for evaluation.  She is currently on 5 L of oxygen with pulse oximetry greater than 92%. Patient had a chest x-ray which showed widespread right greater than left indistinct and interstitial pulmonary opacity compatible with COVID-19 Pneumonia in this setting. No pleural effusion. Patient had a CT angiogram of the chest which showed underlying centrilobular emphysematous change. Fibrosis in the lung bases with traction bronchiectasis likely represents a degree of superimposed usual interstitial pneumonitis. There is airspace opacity consistent with a degree of pneumonia in the right lower lobe. There are mosaic areas of attenuation throughout the lungs in part due to redistribution of blood flow to viable lung  segments. There is probable degree also of small airways obstructive disease. There may be superimposed pneumonia in some of these areas as well. No consolidation evident. Atypical organism pneumonia is felt to be likely in this circumstance. Patient has not received the COVID-19 vaccine   ED Course: Patient is a 86 year old Caucasian female who was sent to the emergency room for evaluation of hypoxia.  Patient tested positive for COVID 19 virus on 02/29/20 and was treated in the outpatient setting with steroids and antibiotics.  She had a room air pulse oximetry of 86% during her follow up appointment that improved following oxygen supplementation at 5 L. Patient received IV Zosyn and Zithromax in the emergency room as well as Decadron and remdesivir.  Review of Systems: As per HPI otherwise 10 point review of systems negative.    Past Medical History:  Diagnosis Date  . Arthritis    back and joints  . 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 Chronic constipation   . CKD (chronic kidney disease), stage III    pt has been referred to nephrologist by PCP, appt. is later this month July 2016  . Full dentures   . Generalized weakness    bilateral legs  . GERD (gastroesophageal reflux disease)   . Hematuria   . History of hiatal hernia   . Hyperlipidemia   . Hypertension   . Hypothyroidism   . Lower extremity edema   . Malaise and fatigue   . OAB (overactive bladder)   .  Renal neoplasm   . SUI (stress urinary incontinence, female)   . Tricuspid valve regurgitation, nonrheumatic cardiologist-- dr Saralyn Pilar   severe per cardiology note    Past Surgical History:  Procedure Laterality Date  . CATARACT EXTRACTION W/ INTRAOCULAR LENS  IMPLANT, BILATERAL  2001  . CYSTOSCOPY WITH BIOPSY N/A 07/06/2015   Procedure: CYSTOSCOPY WITH BLADDER BIOPSY;  Surgeon: Kathie Rhodes, MD;  Location: [facility];  Service: Urology;  Laterality: N/A;  . HIP PINNING,CANNULATED Right 09/30/2016   Procedure: CANNULATED HIP PINNING;  Surgeon: Dereck Leep, MD;  Location: [facility];  Service: Orthopedics;  Laterality: Right;  . TRANSTHORACIC ECHOCARDIOGRAM  04-06-2013   dr Sheppard Coil paraschos   mild to moderate LVH,  ef 60%/  moderate AI , valve area 2.6cm^2/  moderate to severe TI/  mild MI  . TRANSURETHRAL RESECTION OF BLADDER TUMOR WITH GYRUS (TURBT-GYRUS) N/A 05/18/2015   Procedure: TRANSURETHRAL RESECTION OF BLADDER TUMOR WITH GYRUS (TURBT-GYRUS);  Surgeon: Kathie Rhodes, MD;  Location: [facility];  Service: Urology;  Laterality: N/A;  . [place]     reports that she quit smoking about 51 years ago. Her smoking use included cigarettes. She has a 40.00 pack-year smoking history. She has never used smokeless tobacco. She reports that she does not drink alcohol or use drugs.  Allergies  Allergen Reactions  . Augmentin [Amoxicillin-Pot Clavulanate] Nausea Only  . Boniva [Ibandronic Acid] Other (See Comments)    unknown  . Ceftin [Cefuroxime] Other (See Comments)  . Fosamax [Alendronate] Other (See Comments)    unknown  . Lipitor [Atorvastatin] Other (See Comments)    MUSCLE PAIN  . Sulfa Antibiotics Nausea Only  . Thiazide-Type Diuretics Other (See Comments)    unknown    Family History  Problem Relation Age of Onset  . Hypertension Other      Prior to Admission medications   Medication Sig Start Date End Date Taking? Authorizing Provider  albuterol (VENTOLIN HFA) 108 (90 Base) MCG/ACT inhaler Inhale 1-2 puffs into the lungs every 6 (six) hours as needed for shortness of breath or wheezing. 03/02/20  Yes [provider]  amLODipine (NORVASC) 5 MG tablet Take 5 mg by mouth daily.    Yes [provider]  Calcium 500-100 MG-UNIT CHEW Chew 2 tablets by mouth daily.   Yes [provider]  Cholecalciferol (VITAMIN D)  2000 units CAPS Take 2,000 Units by mouth daily.    Yes [provider]  ELIQUIS 2.5 MG TABS tablet Take 2.5 mg by mouth 2 (two) times daily. 02/28/20  Yes [provider]  levofloxacin (LEVAQUIN) 250 MG tablet Take 250 mg by mouth daily. 03/08/20 03/14/20 Yes [provider]  levothyroxine (SYNTHROID, LEVOTHROID) 50 MCG tablet Take 50 mcg by mouth daily before breakfast.   Yes [provider]  metoprolol succinate (TOPROL-XL) 50 MG 24 hr tablet Take 50 mg by mouth daily. 01/03/20  Yes [provider]  omeprazole (PRILOSEC) 40 MG capsule Take 40 mg by mouth every morning.    Yes [provider]    Physical Exam: Vitals:   03/19/2020 1300 03/16/2020 1400 04/03/2020 1610 04/04/2020 1611  BP: 134/64 (!) 152/88 134/74 134/74  Pulse: 88 (!) 114 99   Resp: 18 (!) 24    Temp:      TempSrc:      SpO2: 93% 95%    Weight:      Height:         Vitals:  03/23/2020 1300 04/03/2020 1400 04/02/2020 1610 04/03/2020 1611  BP: 134/64 (!) 152/88 134/74 134/74  Pulse: 88 (!) 114 99   Resp: 18 (!) 24    Temp:      TempSrc:      SpO2: 93% 95%    Weight:      Height:        Constitutional: NAD, alert and oriented x 3 Eyes: PERRL, lids and conjunctivae normal ENMT: Mucous membranes are moist.  Neck: normal, supple, no masses, no thyromegaly Respiratory: Air movement in all lung fields, no wheezing, faint crackles at the bases. Normal respiratory effort. No accessory muscle use.  Cardiovascular: Regular rate and rhythm, no murmurs / rubs / gallops. No extremity edema. 2+ pedal pulses. No carotid bruits.  Abdomen: no tenderness, no masses palpated. No hepatosplenomegaly. Bowel sounds positive.  Musculoskeletal: no clubbing / cyanosis. No joint deformity upper and lower extremities.  Skin: no rashes, lesions, ulcers.  Neurologic: No gross focal neurologic deficit. Psychiatric: Normal mood and affect.   Labs on Admission: I have personally reviewed following labs  and imaging studies  CBC: Recent Labs  Lab 03/27/2020 1133  WBC 6.1  NEUTROABS 5.8  HGB 13.6  HCT 40.3  MCV 78.6*  PLT 315   Basic Metabolic Panel: Recent Labs  Lab 04/02/2020 1133  NA 124*  K 4.1  CL 92*  CO2 22  GLUCOSE 112*  BUN 23  CREATININE 1.34*  CALCIUM 8.2*   GFR: Estimated Creatinine Clearance: 16.8 mL/min (A) (by C-G formula based on SCr of 1.34 mg/dL (H)). Liver Function Tests: Recent Labs  Lab 04/03/2020 1133  AST 26  ALT 8  ALKPHOS 68  BILITOT 1.0  PROT 6.6  ALBUMIN 3.1*   No results for input(s): LIPASE, AMYLASE in the last 168 hours. No results for input(s): AMMONIA in the last 168 hours. Coagulation Profile: No results for input(s): INR, PROTIME in the last 168 hours. Cardiac Enzymes: No results for input(s): CKTOTAL, CKMB, CKMBINDEX, TROPONINI in the last 168 hours. BNP (last 3 results) No results for input(s): PROBNP in the last 8760 hours. HbA1C: No results for input(s): HGBA1C in the last 72 hours. CBG: No results for input(s): GLUCAP in the last 168 hours. Lipid Profile: Recent Labs    03/24/2020 1133  TRIG 94   Thyroid Function Tests: No results for input(s): TSH, T4TOTAL, FREET4, T3FREE, THYROIDAB in the last 72 hours. Anemia Panel: Recent Labs    04/06/2020 1133  FERRITIN 170   Urine analysis:    Component Value Date/Time   COLORURINE YELLOW (A) 09/14/2015 1422   APPEARANCEUR CLEAR (A) 09/14/2015 1422   APPEARANCEUR Clear 03/17/2013 1345   LABSPEC 1.013 09/14/2015 1422   LABSPEC 1.009 03/17/2013 1345   PHURINE 5.0 09/14/2015 1422   GLUCOSEU NEGATIVE 09/14/2015 1422   GLUCOSEU Negative 03/17/2013 1345   HGBUR 2+ (A) 09/14/2015 1422   BILIRUBINUR NEGATIVE 09/14/2015 1422   BILIRUBINUR Negative 03/17/2013 1345   KETONESUR NEGATIVE 09/14/2015 1422   PROTEINUR 100 (A) 09/14/2015 1422   NITRITE NEGATIVE 09/14/2015 1422   LEUKOCYTESUR TRACE (A) 09/14/2015 1422   LEUKOCYTESUR 1+ 03/17/2013 1345    Radiological Exams on  Admission: CT Angio Chest PE W and/or Wo Contrast  Result Date: 03/21/2020 CLINICAL DATA:  Shortness of breath.  Recent COVID-19 positive EXAM: CT ANGIOGRAPHY CHEST WITH CONTRAST TECHNIQUE: Multidetector CT imaging of the chest was performed using the standard protocol during bolus administration of intravenous contrast. Multiplanar CT image reconstructions and MIPs were obtained to  evaluate the vascular anatomy. CONTRAST:  51mL OMNIPAQUE IOHEXOL 350 MG/ML SOLN COMPARISON:  Chest radiograph Mar 13, 2020 FINDINGS: Cardiovascular: There is no demonstrable pulmonary embolus. There is no appreciable thoracic aortic aneurysm. No dissection is seen; note that the contrast bolus in the aorta is less than optimal for potential dissection assessment. Visualized great vessels show scattered foci of atherosclerotic calcification. There is aortic atherosclerosis. There are multiple foci of coronary artery calcification. There is no pericardial effusion or pericardial thickening. Heart is mildly enlarged in a generalized manner. There is prominence of the main pulmonary outflow tract measuring 3.4 cm. Mediastinum/Nodes: Thyroid appears unremarkable. There is no appreciable thoracic adenopathy. There is a large paraesophageal hernia with much of the stomach above the diaphragm. Lungs/Pleura: There is underlying centrilobular emphysematous change. There is bullous disease in the left lower lobe. There is fibrosis throughout the lower lobe regions with traction bronchiectatic change in each lower lobe. There is a somewhat mosaic attenuation throughout the lungs. There is airspace opacity in the right lower lobe consistent with a degree of pneumonia. Other areas of somewhat ill-defined opacity are superimposed on apparent fibrosis. Upper Abdomen: There is upper abdominal aortic atherosclerosis. There is calcification in multiple visualized upper abdominal mesenteric arterial vessels. Visualized upper abdominal structures otherwise  appear unremarkable. Musculoskeletal: There is anterior wedging of the T1 vertebral body. There are no blastic or lytic bone lesions. No chest wall lesions evident. Review of the MIP images confirms the above findings. IMPRESSION: 1. No demonstrable pulmonary embolus. No thoracic aortic aneurysm. No dissection is with note that the contrast bolus within the aorta is not sufficient for confident dissection assessment. There are foci of aortic atherosclerosis as well as foci of great vessel and coronary artery calcification. Mild cardiac enlargement in a generalized manner. 2. Underlying centrilobular emphysematous change. Fibrosis in the lung bases with traction bronchiectasis likely represents a degree of superimposed usual interstitial pneumonitis. There is airspace opacity consistent with a degree of pneumonia in the right lower lobe. There are mosaic areas of attenuation throughout the lungs in part due to redistribution of blood flow to viable lung segments. There is probable degree also of small airways obstructive disease. There may be superimposed pneumonia in some of these areas as well. No consolidation evident. Atypical organism pneumonia is felt to be likely in this circumstance. 3. Sizable paraesophageal hernia with much of the stomach above the diaphragm. 4.  No evident adenopathy. 5. Prominence of the main pulmonary outflow tract is felt to be indicative of a degree of pulmonary arterial hypertension. Aortic Atherosclerosis (ICD10-I70.0) and Emphysema (ICD10-J43.9). Electronically Signed   By: Lowella Grip III M.D.   On: 03/10/2020 13:33   DG Chest Port 1 View  Result Date: 04/01/2020 CLINICAL DATA:  86 year old female positive for COVID-19. 3 weeks ago. Continued shortness of breath with activity. EXAM: PORTABLE CHEST 1 VIEW COMPARISON:  Portable chest 09/29/2016 and earlier. FINDINGS: Stable cardiomegaly and mediastinal contours. Stable large lung volumes. Coarse and indistinct widespread  right lung and left perihilar and lower lung opacity is new since 2017. No superimposed pneumothorax or definite pleural effusion. Visualized tracheal air column is within normal limits. No acute osseous abnormality identified. Negative visible bowel gas pattern. IMPRESSION: Widespread right greater than left indistinct and interstitial pulmonary opacity compatible with COVID-19 Pneumonia in this setting. No pleural effusion. Electronically Signed   By: Genevie Ann M.D.   On: 03/19/2020 11:07    EKG: Independently reviewed. Atrial fibrillation  Assessment/Plan Principal Problem:  Pneumonia due to COVID-19 virus Active Problems:   Hypothyroidism   Hypertension   GERD (gastroesophageal reflux disease)   Atrial fibrillation, chronic (HCC)   Hyponatremia   Acute on chronic respiratory failure with hypoxia ([place])     COVID-19 pneumonia Patient was diagnosed with Covid 19 infection on 02/29/20 and was sent to the emergency room for evaluation of shortness of breath and hypoxia (room air pulse oximetry was 86%) Chest x-ray showed widespread right greater than left indistinct and interstitial pulmonary opacity compatible with COVID-19 Pneumonia in this setting. No pleural effusion. We will start patient on Decadron and remdesivir   Acute hypoxic respiratory failure Secondary to COVID-19 pneumonia Patient is currently on 5 L of oxygen via nasal cannula to maintain pulse oximetry greater than 92% Continue oxygen supplementation   Hypothyroidism Continue Synthroid   History of chronic atrial fibrillation Continue metoprolol for rate control Patient is on Eliquis as primary prophylaxis for an acute   Hyponatremia Probably from poor oral intake We will monitor closely  DVT prophylaxis: Eliquis Code Status: DNR Family Communication: Discussing plan of care with patient and her daughter at the bedside.  All questions and concerns were addressed.  They verbalized understanding and agreed  with the plan. Disposition Plan: Back to previous home environment Consults called: None    Emerald Gehres MD [facility]     04/03/2020, 4:28 PM

## 2020-03-13 NOTE — Consult Note (Signed)
PHARMACY -  BRIEF ANTIBIOTIC NOTE   Pharmacy has received consult(s) for Vancomycin from an ED provider.  The patient's profile has been reviewed for ht/wt/allergies/indication/available labs.    One time order(s) placed for Vancomycin 1g x1 dose. Pharmacy ordered MRSA PCR.   Further antibiotics/pharmacy consults should be ordered by admitting physician if indicated.                       Thank you, Rowland Lathe 03/30/2020  2:07 PM

## 2020-03-13 NOTE — Progress Notes (Signed)
Remdesivir - Pharmacy Brief Note    A/P:  Patient was seen in the [facility] clinic and was 84% on RA. COVID + 3 weeks ago, states gets short winded with movement.   Remdesivir 200 mg IVPB once followed by 100 mg IVPB daily x 4 days.   Kristeen Miss, PharmD Clinical Pharmacist 03/12/2020 2:04 PM

## 2020-03-13 NOTE — Plan of Care (Signed)
New admission this shift

## 2020-03-13 NOTE — ED Notes (Signed)
This note is not being shared with the patient for the following reason: To prevent harm (release of this note would result in harm to the life or physical safety of the patient or another). Pt was transported to floor with RN on stretcher, with 6LPM oxygen via [place] and on monitor.  As this RN was getting pt ready for transport. RN informed the daughter at bedside, that she will not be allowed up, since pt is on covid-19 precautions. Daughter informed me that "one way or another, tomorrow I am going to get up there to see her (referring to the patient), even if that means I bring my gun in and make a hole." Daughter stated she has a family member in criminal justice and she will also be letting them know "everything." This RN informed the ED charge nurse who informed the [facility] and security. This RN also informed the primary RN of pt on the unit and the charge nurse over the inpatient unit where the pt is admitted to.

## 2020-03-13 NOTE — ED Triage Notes (Signed)
Arrives from [facility] for ED evaluation for hypoxia.  Per report patient is COVID +, diagnosed 4/21.  Today C/O worsening SOB.  RA sats 86%.  Arrives on oxygen.  AAOx3.  No SOB/ DOE.  NAD

## 2020-03-13 NOTE — ED Notes (Signed)
Hand off of care and report given to Almyra Free, [place]

## 2020-03-13 NOTE — ED Triage Notes (Signed)
Pt arrives from [facility] for a check-up. COVID + 3 weeks ago, states gets short winded with movement. Was 84% on RA at [facility], placed on 2 L. Was 88% on 2 L upon arrival to ER. Increased to 4 L [place] and came up to 90%. Pt able to talk in complete sentences. A fib on monitor.

## 2020-03-14 DIAGNOSIS — J9621 Acute and chronic respiratory failure with hypoxia: Secondary | ICD-10-CM

## 2020-03-14 LAB — COMPREHENSIVE METABOLIC PANEL
ALT: 9 U/L (ref 0–44)
AST: 24 U/L (ref 15–41)
Albumin: 2.6 g/dL — ABNORMAL LOW (ref 3.5–5.0)
Alkaline Phosphatase: 66 U/L (ref 38–126)
Anion gap: 8 (ref 5–15)
BUN: 20 mg/dL (ref 8–23)
CO2: 22 mmol/L (ref 22–32)
Calcium: 8.1 mg/dL — ABNORMAL LOW (ref 8.9–10.3)
Chloride: 95 mmol/L — ABNORMAL LOW (ref 98–111)
Creatinine, Ser: 0.99 mg/dL (ref 0.44–1.00)
GFR calc Af Amer: 55 mL/min — ABNORMAL LOW (ref >60)
GFR calc non Af Amer: 47 mL/min — ABNORMAL LOW (ref >60)
Glucose, Bld: 124 mg/dL — ABNORMAL HIGH (ref 70–99)
Potassium: 3.8 mmol/L (ref 3.5–5.1)
Sodium: 125 mmol/L — ABNORMAL LOW (ref 135–145)
Total Bilirubin: 1 mg/dL (ref 0.3–1.2)
Total Protein: 6 g/dL — ABNORMAL LOW (ref 6.5–8.1)

## 2020-03-14 LAB — CBC WITH DIFFERENTIAL/PLATELET
Abs Immature Granulocytes: 0.01 10*3/uL (ref 0.00–0.07)
Basophils Absolute: 0 10*3/uL (ref 0.0–0.1)
Basophils Relative: 0 %
Eosinophils Absolute: 0 10*3/uL (ref 0.0–0.5)
Eosinophils Relative: 0 %
HCT: 38.6 % (ref 36.0–46.0)
Hemoglobin: 12.7 g/dL (ref 12.0–15.0)
Immature Granulocytes: 0 %
Lymphocytes Relative: 3 %
Lymphs Abs: 0.1 10*3/uL — ABNORMAL LOW (ref 0.7–4.0)
MCH: 26 pg (ref 26.0–34.0)
MCHC: 32.9 g/dL (ref 30.0–36.0)
MCV: 79.1 fL — ABNORMAL LOW (ref 80.0–100.0)
Monocytes Absolute: 0.1 10*3/uL (ref 0.1–1.0)
Monocytes Relative: 2 %
Neutro Abs: 2.9 10*3/uL (ref 1.7–7.7)
Neutrophils Relative %: 95 %
Platelets: 221 10*3/uL (ref 150–400)
RBC: 4.88 MIL/uL (ref 3.87–5.11)
RDW: 18.6 % — ABNORMAL HIGH (ref 11.5–15.5)
WBC: 3.1 10*3/uL — ABNORMAL LOW (ref 4.0–10.5)
nRBC: 0 % (ref 0.0–0.2)

## 2020-03-14 LAB — PHOSPHORUS: Phosphorus: 2.9 mg/dL (ref 2.5–4.6)

## 2020-03-14 LAB — FIBRIN DERIVATIVES D-DIMER (ARMC ONLY): Fibrin derivatives D-dimer (ARMC): 2731.13 ng/mL (FEU) — ABNORMAL HIGH (ref 0.00–499.00)

## 2020-03-14 LAB — FERRITIN: Ferritin: 162 ng/mL (ref 11–307)

## 2020-03-14 LAB — C-REACTIVE PROTEIN: CRP: 15.8 mg/dL — ABNORMAL HIGH (ref <1.0)

## 2020-03-14 LAB — MAGNESIUM: Magnesium: 1.7 mg/dL (ref 1.7–2.4)

## 2020-03-14 MED ORDER — CHLORHEXIDINE GLUCONATE CLOTH 2 % EX PADS
6.0000 | MEDICATED_PAD | Freq: Every day | CUTANEOUS | Status: DC
  Administered 2020-03-14 – 2020-03-16 (×3): 6 via TOPICAL

## 2020-03-14 NOTE — Progress Notes (Signed)
Pt is awake oriented and pleasant. Up to the [facility] heart rate increase and o2 sat drops while getting to [facility]. assisted pt back to bed on 12LHF and running afib. Pt is now 94% HR 89 will continue to monitor and attempt to wean O2.

## 2020-03-14 NOTE — Progress Notes (Signed)
Attempted to call report to ICU, spoke with charge nurse who states ICU will not be accepting the patient, however clerical on 1C states the transfer has been accepted.  Also spoke with [facility] who agreed transfer is appropriate, made aware of situation.

## 2020-03-14 NOTE — Progress Notes (Signed)
O2 alarm going off, went in patient room to assess, pt. Is at rest on 12L HFNC and o2 saturations is at 77%. Pt. Denies any SOB or discomfort. Increased o2 to 15L HFNC and called respiratory therapy for assistance. Respiratory brought non rebreather for additional support. Pt. Recovered to 90% after about 10 minutes of increased oxygen therapy. Pt. Remains at 90% at this time, without any exertion.

## 2020-03-14 NOTE — Progress Notes (Signed)
Telephone report given to [place], [place]

## 2020-03-14 NOTE — Progress Notes (Signed)
Notified by clerical that granddaughter called to discuss transferring the patient to another hospital. Attempted to call back, but phone call went straight to voicemail.

## 2020-03-14 NOTE — Progress Notes (Signed)
Patient is being combative while trying to get vital signs and refusing to let this nurse put on bp cuff. Patient is covering her face with blanket and pushing me away when trying to apply bp cuff. She also is refusing to take her inhaler. Attempted to reorient patient and she is cussing at this nurse will continue to monitor and assess again in a bit.

## 2020-03-14 NOTE — Progress Notes (Signed)
CH saw pt. through window while rounding on ICU; pt. awake and apparently alert --squinted at [facility] badge.  [place] motioned to pt. that he hoped to pray for her from behind glass door.  [place] prayed outside rm.  Pt. seemed appreciative.  [employer] may attempt phone visit later today/tonight.    03/14/20 1600  Clinical Encounter Type  Visited With Patient  Visit Type Initial;Social support;Psychological support;Critical Care  Referral From Other (Comment) (Routine Rounding)  Spiritual Encounters  Spiritual Needs Emotional  Stress Factors  Patient Stress Factors Not reviewed

## 2020-03-14 NOTE — Progress Notes (Addendum)
PROGRESS NOTE    Jody Mcclain  BDZ:[id number] DOB: 1919-04-02 DOA: 03/30/2020 PCP: Glendon Axe, MD       Assessment & Plan:   Principal Problem:   Pneumonia due to COVID-19 virus Active Problems:   Hypothyroidism   Hypertension   GERD (gastroesophageal reflux disease)   Atrial fibrillation, chronic ([place])   Hyponatremia   Acute on chronic respiratory failure with hypoxia ([place])   COVID-19 pneumonia: dx on 02/29/20. Continue on decadron, remdesivir, vtamin c & zinc. Continue on supplemental oxygen and wean as tolerated. Continue on bronchodilators. Inflammatory markers are elevated. Encourage incentive spirometry & flutter valve. Will transfer to stepdown w/ increased oxygen demand today, currently on 15L [place]  Acute hypoxic respiratory failure: secondary to COVID-19 pneumonia. Continue supplemental oxygen and wean as tolerated. Increased oxygen demand today as pt is currently on 15L [place]. Management as stated above  Leukopenia: likely secondary to [place]. Will continue to monitor   Hypothyroidism: continue on levothyroxine   History of chronic atrial fibrillation: continue metoprolol for rate control & eliquis as primary prophylaxis for CVA  Hyponatremia: probably from poor oral intake. Trending up slightly.   Poor po intake: dietitian consulted  Generalized weakness: will consult PT/OT when pt's respiratory status has improved    DVT prophylaxis: eliquis  Code Status: DNR Family Communication: discussed pt's care w/ pt's granddaughter, [facility], and answered her questions. Pt's daughter, [name], evidently was threatening to bring a gun to the hospital so security was notified and evidently so were the police. [name] is unable to have contact with the pt while she is here. Disposition Plan: depends on PT/OT recs   Consultants:      Procedures:    Antimicrobials:    Subjective: Pt c/o shortness of breath   Objective: Vitals:   03/14/2020 2340  03/14/20 0200 03/14/20 0400 03/14/20 0510  BP: 124/75  (!) 151/90   Pulse: 85  90   Resp: (!) 22 20 20    Temp: (!) 97.3 F (36.3 C) (!) 96.9 F (36.1 C) (!) 96.9 F (36.1 C)   TempSrc: Axillary Axillary Axillary   SpO2: 94% 95% 95% 95%  Weight:      Height:        Intake/Output Summary (Last 24 hours) at 03/14/2020 0753 Last data filed at 03/30/2020 1412 Gross per 24 hour  Intake 500 ml  Output --  Net 500 ml   Filed Weights   04/02/2020 1026  Weight: 49.9 kg    Examination:  General exam: Appears calm and comfortable  Respiratory system: diminished breath sounds. No rales  Cardiovascular system: S1 & S2 +. No rubs, gallops or clicks.  Gastrointestinal system: Abdomen is nondistended, soft and nontender. Normal bowel sounds heard. Central nervous system: Alert and oriented. Moves all 4 extremities  Psychiatry: Judgement and insight appear normal. Flat mood and affect      Data Reviewed: I have personally reviewed following labs and imaging studies  CBC: Recent Labs  Lab 03/30/2020 1133 03/14/20 0448  WBC 6.1 3.1*  NEUTROABS 5.8 2.9  HGB 13.6 12.7  HCT 40.3 38.6  MCV 78.6* 79.1*  PLT 241 341   Basic Metabolic Panel: Recent Labs  Lab 03/23/2020 1133 03/14/20 0448  NA 124* 125*  K 4.1 3.8  CL 92* 95*  CO2 22 22  GLUCOSE 112* 124*  BUN 23 20  CREATININE 1.34* 0.99  CALCIUM 8.2* 8.1*  MG  --  1.7  PHOS  --  2.9   GFR: Estimated  Creatinine Clearance: 22.8 mL/min (by C-G formula based on SCr of 0.99 mg/dL). Liver Function Tests: Recent Labs  Lab 03/16/2020 1133 03/14/20 0448  AST 26 24  ALT 8 9  ALKPHOS 68 66  BILITOT 1.0 1.0  PROT 6.6 6.0*  ALBUMIN 3.1* 2.6*   No results for input(s): LIPASE, AMYLASE in the last 168 hours. No results for input(s): AMMONIA in the last 168 hours. Coagulation Profile: No results for input(s): INR, PROTIME in the last 168 hours. Cardiac Enzymes: No results for input(s): CKTOTAL, CKMB, CKMBINDEX, TROPONINI in the last  168 hours. BNP (last 3 results) No results for input(s): PROBNP in the last 8760 hours. HbA1C: No results for input(s): HGBA1C in the last 72 hours. CBG: No results for input(s): GLUCAP in the last 168 hours. Lipid Profile: Recent Labs    03/29/2020 1133  TRIG 94   Thyroid Function Tests: No results for input(s): TSH, T4TOTAL, FREET4, T3FREE, THYROIDAB in the last 72 hours. Anemia Panel: Recent Labs    03/16/2020 1133 03/14/20 0448  FERRITIN 170 162   Sepsis Labs: Recent Labs  Lab 03/22/2020 1133 03/30/2020 1337  PROCALCITON 0.17  --   LATICACIDVEN 2.8* 2.2*    No results found for this or any previous visit (from the past 240 hour(s)).       Radiology Studies: CT Angio Chest PE W and/or Wo Contrast  Result Date: 04/07/2020 CLINICAL DATA:  Shortness of breath.  Recent COVID-19 positive EXAM: CT ANGIOGRAPHY CHEST WITH CONTRAST TECHNIQUE: Multidetector CT imaging of the chest was performed using the standard protocol during bolus administration of intravenous contrast. Multiplanar CT image reconstructions and MIPs were obtained to evaluate the vascular anatomy. CONTRAST:  36mL OMNIPAQUE IOHEXOL 350 MG/ML SOLN COMPARISON:  Chest radiograph Mar 13, 2020 FINDINGS: Cardiovascular: There is no demonstrable pulmonary embolus. There is no appreciable thoracic aortic aneurysm. No dissection is seen; note that the contrast bolus in the aorta is less than optimal for potential dissection assessment. Visualized great vessels show scattered foci of atherosclerotic calcification. There is aortic atherosclerosis. There are multiple foci of coronary artery calcification. There is no pericardial effusion or pericardial thickening. Heart is mildly enlarged in a generalized manner. There is prominence of the main pulmonary outflow tract measuring 3.4 cm. Mediastinum/Nodes: Thyroid appears unremarkable. There is no appreciable thoracic adenopathy. There is a large paraesophageal hernia with much of the  stomach above the diaphragm. Lungs/Pleura: There is underlying centrilobular emphysematous change. There is bullous disease in the left lower lobe. There is fibrosis throughout the lower lobe regions with traction bronchiectatic change in each lower lobe. There is a somewhat mosaic attenuation throughout the lungs. There is airspace opacity in the right lower lobe consistent with a degree of pneumonia. Other areas of somewhat ill-defined opacity are superimposed on apparent fibrosis. Upper Abdomen: There is upper abdominal aortic atherosclerosis. There is calcification in multiple visualized upper abdominal mesenteric arterial vessels. Visualized upper abdominal structures otherwise appear unremarkable. Musculoskeletal: There is anterior wedging of the T1 vertebral body. There are no blastic or lytic bone lesions. No chest wall lesions evident. Review of the MIP images confirms the above findings. IMPRESSION: 1. No demonstrable pulmonary embolus. No thoracic aortic aneurysm. No dissection is with note that the contrast bolus within the aorta is not sufficient for confident dissection assessment. There are foci of aortic atherosclerosis as well as foci of great vessel and coronary artery calcification. Mild cardiac enlargement in a generalized manner. 2. Underlying centrilobular emphysematous change. Fibrosis in the lung  bases with traction bronchiectasis likely represents a degree of superimposed usual interstitial pneumonitis. There is airspace opacity consistent with a degree of pneumonia in the right lower lobe. There are mosaic areas of attenuation throughout the lungs in part due to redistribution of blood flow to viable lung segments. There is probable degree also of small airways obstructive disease. There may be superimposed pneumonia in some of these areas as well. No consolidation evident. Atypical organism pneumonia is felt to be likely in this circumstance. 3. Sizable paraesophageal hernia with much of  the stomach above the diaphragm. 4.  No evident adenopathy. 5. Prominence of the main pulmonary outflow tract is felt to be indicative of a degree of pulmonary arterial hypertension. Aortic Atherosclerosis (ICD10-I70.0) and Emphysema (ICD10-J43.9). Electronically Signed   By: Lowella Grip III M.D.   On: 03/16/2020 13:33   DG Chest Port 1 View  Result Date: 03/28/2020 CLINICAL DATA:  86 year old female positive for COVID-19. 3 weeks ago. Continued shortness of breath with activity. EXAM: PORTABLE CHEST 1 VIEW COMPARISON:  Portable chest 09/29/2016 and earlier. FINDINGS: Stable cardiomegaly and mediastinal contours. Stable large lung volumes. Coarse and indistinct widespread right lung and left perihilar and lower lung opacity is new since 2017. No superimposed pneumothorax or definite pleural effusion. Visualized tracheal air column is within normal limits. No acute osseous abnormality identified. Negative visible bowel gas pattern. IMPRESSION: Widespread right greater than left indistinct and interstitial pulmonary opacity compatible with COVID-19 Pneumonia in this setting. No pleural effusion. Electronically Signed   By: Genevie Ann M.D.   On: 03/24/2020 11:07        Scheduled Meds: . albuterol  2 puff Inhalation Q6H  . amLODipine  5 mg Oral Daily  . apixaban  2.5 mg Oral BID  . vitamin C  500 mg Oral Daily  . calcium-vitamin D  2 tablet Oral Daily  . cholecalciferol  2,000 Units Oral Daily  . dexamethasone (DECADRON) injection  6 mg Intravenous Q24H  . levothyroxine  50 mcg Oral Q0600  . metoprolol succinate  50 mg Oral Daily  . pantoprazole  40 mg Oral Daily  . sodium chloride flush  3 mL Intravenous Q12H  . zinc sulfate  220 mg Oral Daily   Continuous Infusions: . sodium chloride    . remdesivir 100 mg in NS 100 mL       LOS: 1 day    Time spent: 35 mins     Wyvonnia Dusky, MD [facility] Pager [phone]  If 7PM-7AM, please contact  night-coverage [email or web address] 03/14/2020, 7:53 AM

## 2020-03-14 NOTE — Progress Notes (Signed)
Pt transferred to SD bed. Oxygen probe placed on pt forehead. Pt is sating 93-100% on 3 L [place]. A&OX4. In no acute distress at this time, eating her soup. Will continue to monitor.

## 2020-03-14 NOTE — Progress Notes (Signed)
Assisted tele visit to patient with family member.  Haiden Clucas Anderson, RN   

## 2020-03-14 NOTE — Progress Notes (Signed)
Spoke with Helene Kelp, grand daughter, and provided update on pt. Status.

## 2020-03-14 NOTE — Progress Notes (Signed)
Attempted to call report to Raquel Sarna, [place]. Stated she was in a room and will call back.

## 2020-03-15 ENCOUNTER — Inpatient Hospital Stay: Payer: Medicare Other

## 2020-03-15 LAB — CBC WITH DIFFERENTIAL/PLATELET
Abs Immature Granulocytes: 0.07 10*3/uL (ref 0.00–0.07)
Basophils Absolute: 0 10*3/uL (ref 0.0–0.1)
Basophils Relative: 0 %
Eosinophils Absolute: 0 10*3/uL (ref 0.0–0.5)
Eosinophils Relative: 0 %
HCT: 38 % (ref 36.0–46.0)
Hemoglobin: 12.6 g/dL (ref 12.0–15.0)
Immature Granulocytes: 1 %
Lymphocytes Relative: 2 %
Lymphs Abs: 0.2 10*3/uL — ABNORMAL LOW (ref 0.7–4.0)
MCH: 25.9 pg — ABNORMAL LOW (ref 26.0–34.0)
MCHC: 33.2 g/dL (ref 30.0–36.0)
MCV: 78.2 fL — ABNORMAL LOW (ref 80.0–100.0)
Monocytes Absolute: 0.3 10*3/uL (ref 0.1–1.0)
Monocytes Relative: 3 %
Neutro Abs: 8.4 10*3/uL — ABNORMAL HIGH (ref 1.7–7.7)
Neutrophils Relative %: 94 %
Platelets: 242 10*3/uL (ref 150–400)
RBC: 4.86 MIL/uL (ref 3.87–5.11)
RDW: 18.6 % — ABNORMAL HIGH (ref 11.5–15.5)
WBC: 8.9 10*3/uL (ref 4.0–10.5)
nRBC: 0 % (ref 0.0–0.2)

## 2020-03-15 LAB — COMPREHENSIVE METABOLIC PANEL
ALT: 8 U/L (ref 0–44)
AST: 22 U/L (ref 15–41)
Albumin: 2.5 g/dL — ABNORMAL LOW (ref 3.5–5.0)
Alkaline Phosphatase: 67 U/L (ref 38–126)
Anion gap: 7 (ref 5–15)
BUN: 32 mg/dL — ABNORMAL HIGH (ref 8–23)
CO2: 22 mmol/L (ref 22–32)
Calcium: 8.2 mg/dL — ABNORMAL LOW (ref 8.9–10.3)
Chloride: 99 mmol/L (ref 98–111)
Creatinine, Ser: 1.01 mg/dL — ABNORMAL HIGH (ref 0.44–1.00)
GFR calc Af Amer: 54 mL/min — ABNORMAL LOW (ref >60)
GFR calc non Af Amer: 46 mL/min — ABNORMAL LOW (ref >60)
Glucose, Bld: 115 mg/dL — ABNORMAL HIGH (ref 70–99)
Potassium: 4.3 mmol/L (ref 3.5–5.1)
Sodium: 128 mmol/L — ABNORMAL LOW (ref 135–145)
Total Bilirubin: 0.9 mg/dL (ref 0.3–1.2)
Total Protein: 5.8 g/dL — ABNORMAL LOW (ref 6.5–8.1)

## 2020-03-15 LAB — FERRITIN: Ferritin: 144 ng/mL (ref 11–307)

## 2020-03-15 LAB — MAGNESIUM: Magnesium: 2 mg/dL (ref 1.7–2.4)

## 2020-03-15 LAB — C-REACTIVE PROTEIN: CRP: 9.3 mg/dL — ABNORMAL HIGH (ref <1.0)

## 2020-03-15 LAB — PHOSPHORUS: Phosphorus: 3.1 mg/dL (ref 2.5–4.6)

## 2020-03-15 LAB — FIBRIN DERIVATIVES D-DIMER (ARMC ONLY): Fibrin derivatives D-dimer (ARMC): 4911.09 ng/mL (FEU) — ABNORMAL HIGH (ref 0.00–499.00)

## 2020-03-15 MED ORDER — ENSURE ENLIVE PO LIQD
237.0000 mL | Freq: Three times a day (TID) | ORAL | Status: DC
  Administered 2020-03-15 (×3): 237 mL via ORAL

## 2020-03-15 MED ORDER — PHENOL 1.4 % MT LIQD
1.0000 | OROMUCOSAL | Status: DC | PRN
  Administered 2020-03-15: 1 via OROMUCOSAL
  Filled 2020-03-15: qty 177

## 2020-03-15 MED ORDER — DOCUSATE SODIUM 100 MG PO CAPS
200.0000 mg | ORAL_CAPSULE | Freq: Two times a day (BID) | ORAL | Status: DC
  Administered 2020-03-15 (×2): 200 mg via ORAL
  Filled 2020-03-15 (×2): qty 2

## 2020-03-15 MED ORDER — ADULT MULTIVITAMIN W/MINERALS CH: 1.0000 | ORAL_TABLET | Freq: Every day | ORAL | Status: DC

## 2020-03-15 NOTE — Progress Notes (Signed)
Jody Mcclain granddaughter made aware pts change in status and possible need to transfer to ICU with bipap, awaiting result of ABG and chest xray

## 2020-03-15 NOTE — Progress Notes (Deleted)
NP Hassan Rowan spoke with Olivia Mackie via [place], updated on pts status and plan, Olivia Mackie also spoke with pt via facetime, pt resting comfortably, pt placed on non rebreather o2 sats 93%

## 2020-03-15 NOTE — Progress Notes (Signed)
Spoke with patients granddaughter Helene Kelp Tressia Miners) Helene Kelp regarding patient plan of care and status. Answered all questions and gave her contact information if any issues arise. Addressed no visitor policy due to isolation. Traci requested that MD call to further discuss DNR. MD made aware.   Awaiting patient to transfer from ICU. Assuming RN made aware of above.   Madlyn Frankel, RN

## 2020-03-15 NOTE — Progress Notes (Signed)
Nightly update given to [facility]

## 2020-03-15 NOTE — Progress Notes (Addendum)
Hassan Rowan NP spoke with granddaughter via facetime, updated on pts status, granddaughter also spoke with pt via facetime, pt placed on non rebreather o2 sats 95%, pt resting comfortably, per NP Hassan Rowan no need to transfer pt at this time

## 2020-03-15 NOTE — Progress Notes (Signed)
PROGRESS NOTE    Jody Mcclain  NLZ:[id number] DOB: 08-Dec-1918 DOA: 03/10/2020 PCP: Glendon Axe, MD       Assessment & Plan:   Principal Problem:   Pneumonia due to COVID-19 virus Active Problems:   Hypothyroidism   Hypertension   GERD (gastroesophageal reflux disease)   Atrial fibrillation, chronic ([place])   Hyponatremia   Acute on chronic respiratory failure with hypoxia ([place])   COVID-19 pneumonia: dx on 02/29/20. Continue on decadron, remdesivir, vtamin c & zinc. Continue on supplemental oxygen and wean as tolerated. Continue on bronchodilators. Inflammatory markers are elevated. Encourage incentive spirometry & flutter valve. Currently on 3L [place] so improved oxygen requirement.  Acute hypoxic respiratory failure: secondary to COVID-19 pneumonia. Continue supplemental oxygen and wean as tolerated. Management as stated above  Leukopenia: resolved  Hypothyroidism: continue on levothyroxine   History of chronic atrial fibrillation: continue metoprolol for rate control & eliquis as primary prophylaxis for CVA  Hyponatremia: probably from poor oral intake. Continues to trend up    Poor po intake: continue on nutritional supplements as per nutrition   Generalized weakness: will likely consult PT/OT tomorrow if RR continues to improve    DVT prophylaxis: eliquis  Code Status: DNR Family Communication: discussed pt's care w/ pt's granddaughter, [name], and answered her questions. Pt's daughter, Jody Mcclain, evidently was threatening to bring a gun to the hospital so security was notified and evidently so were the police.  Disposition Plan: depends on PT/OT recs   Consultants:      Procedures:    Antimicrobials:    Subjective: Pt c/o shortness of breath & poor appetite.   Objective: Vitals:   03/14/20 1600 03/14/20 2100 03/14/20 2122 03/15/20 0100  BP:      Pulse:      Resp:      Temp:   (!) 96.8 F (36 C) (!) 97.2 F (36.2 C)  TempSrc:   Axillary  Axillary  SpO2: 100% 100%    Weight:      Height:        Intake/Output Summary (Last 24 hours) at 03/15/2020 0821 Last data filed at 03/15/2020 0600 Gross per 24 hour  Intake 98.3 ml  Output 400 ml  Net -301.7 ml   Filed Weights   03/25/2020 1026 03/14/20 1339  Weight: 49.9 kg 49 kg    Examination:  General exam: Appears calm and comfortable. Frail appearing  Respiratory system: decreased breath sounds b/l. No wheezes.  Cardiovascular system: S1 & S2 +. No rubs, gallops or clicks.  Gastrointestinal system: Abdomen is nondistended, soft and nontender. hypoactive bowel sounds heard. Central nervous system: Alert and oriented. Moves all 4 extremities  Psychiatry: Judgement and insight appear normal. Flat mood and affect      Data Reviewed: I have personally reviewed following labs and imaging studies  CBC: Recent Labs  Lab 03/12/2020 1133 03/14/20 0448 03/15/20 0426  WBC 6.1 3.1* 8.9  NEUTROABS 5.8 2.9 8.4*  HGB 13.6 12.7 12.6  HCT 40.3 38.6 38.0  MCV 78.6* 79.1* 78.2*  PLT 241 221 902   Basic Metabolic Panel: Recent Labs  Lab 04/05/2020 1133 03/14/20 0448 03/15/20 0426  NA 124* 125* 128*  K 4.1 3.8 4.3  CL 92* 95* 99  CO2 22 22 22   GLUCOSE 112* 124* 115*  BUN 23 20 32*  CREATININE 1.34* 0.99 1.01*  CALCIUM 8.2* 8.1* 8.2*  MG  --  1.7 2.0  PHOS  --  2.9 3.1   GFR: Estimated Creatinine Clearance:  24.1 mL/min (A) (by C-G formula based on SCr of 1.01 mg/dL (H)). Liver Function Tests: Recent Labs  Lab 03/31/2020 1133 03/14/20 0448 03/15/20 0426  AST 26 24 22   ALT 8 9 8   ALKPHOS 68 66 67  BILITOT 1.0 1.0 0.9  PROT 6.6 6.0* 5.8*  ALBUMIN 3.1* 2.6* 2.5*   No results for input(s): LIPASE, AMYLASE in the last 168 hours. No results for input(s): AMMONIA in the last 168 hours. Coagulation Profile: No results for input(s): INR, PROTIME in the last 168 hours. Cardiac Enzymes: No results for input(s): CKTOTAL, CKMB, CKMBINDEX, TROPONINI in the last 168 hours. BNP  (last 3 results) No results for input(s): PROBNP in the last 8760 hours. HbA1C: No results for input(s): HGBA1C in the last 72 hours. CBG: No results for input(s): GLUCAP in the last 168 hours. Lipid Profile: Recent Labs    04/02/2020 1133  TRIG 94   Thyroid Function Tests: No results for input(s): TSH, T4TOTAL, FREET4, T3FREE, THYROIDAB in the last 72 hours. Anemia Panel: Recent Labs    03/14/20 0448 03/15/20 0426  FERRITIN 162 144   Sepsis Labs: Recent Labs  Lab 04/04/2020 1133 03/18/2020 1337  PROCALCITON 0.17  --   LATICACIDVEN 2.8* 2.2*    Recent Results (from the past 240 hour(s))  Blood Culture (routine x 2)     Status: None (Preliminary result)   Collection Time: 04/02/2020 11:33 AM   Specimen: BLOOD  Result Value Ref Range Status   Specimen Description BLOOD LEFT FOREARM  Final   Special Requests   Final    BOTTLES DRAWN AEROBIC AND ANAEROBIC Blood Culture results may not be optimal due to an excessive volume of blood received in culture bottles   Culture   Final    NO GROWTH 2 DAYS Performed at [facility], [address], [place], [place] [address]    Report Status PENDING  Incomplete  Blood Culture (routine x 2)     Status: None (Preliminary result)   Collection Time: 04/08/2020 11:33 AM   Specimen: BLOOD  Result Value Ref Range Status   Specimen Description BLOOD LEFT [facility]  Final   Special Requests   Final    BOTTLES DRAWN AEROBIC AND ANAEROBIC Blood Culture adequate volume   Culture   Final    NO GROWTH 2 DAYS Performed at [facility], [address], [place], [place] [address]    Report Status PENDING  Incomplete         Radiology Studies: CT Angio Chest PE W and/or Wo Contrast  Result Date: 03/10/2020 CLINICAL DATA:  Shortness of breath.  Recent COVID-19 positive EXAM: CT ANGIOGRAPHY CHEST WITH CONTRAST TECHNIQUE: Multidetector CT imaging of the chest was performed using the standard protocol during bolus administration of  intravenous contrast. Multiplanar CT image reconstructions and MIPs were obtained to evaluate the vascular anatomy. CONTRAST:  43mL OMNIPAQUE IOHEXOL 350 MG/ML SOLN COMPARISON:  Chest radiograph Mar 13, 2020 FINDINGS: Cardiovascular: There is no demonstrable pulmonary embolus. There is no appreciable thoracic aortic aneurysm. No dissection is seen; note that the contrast bolus in the aorta is less than optimal for potential dissection assessment. Visualized great vessels show scattered foci of atherosclerotic calcification. There is aortic atherosclerosis. There are multiple foci of coronary artery calcification. There is no pericardial effusion or pericardial thickening. Heart is mildly enlarged in a generalized manner. There is prominence of the main pulmonary outflow tract measuring 3.4 cm. Mediastinum/Nodes: Thyroid appears unremarkable. There is no appreciable thoracic adenopathy.  There is a large paraesophageal hernia with much of the stomach above the diaphragm. Lungs/Pleura: There is underlying centrilobular emphysematous change. There is bullous disease in the left lower lobe. There is fibrosis throughout the lower lobe regions with traction bronchiectatic change in each lower lobe. There is a somewhat mosaic attenuation throughout the lungs. There is airspace opacity in the right lower lobe consistent with a degree of pneumonia. Other areas of somewhat ill-defined opacity are superimposed on apparent fibrosis. Upper Abdomen: There is upper abdominal aortic atherosclerosis. There is calcification in multiple visualized upper abdominal mesenteric arterial vessels. Visualized upper abdominal structures otherwise appear unremarkable. Musculoskeletal: There is anterior wedging of the T1 vertebral body. There are no blastic or lytic bone lesions. No chest wall lesions evident. Review of the MIP images confirms the above findings. IMPRESSION: 1. No demonstrable pulmonary embolus. No thoracic aortic aneurysm. No  dissection is with note that the contrast bolus within the aorta is not sufficient for confident dissection assessment. There are foci of aortic atherosclerosis as well as foci of great vessel and coronary artery calcification. Mild cardiac enlargement in a generalized manner. 2. Underlying centrilobular emphysematous change. Fibrosis in the lung bases with traction bronchiectasis likely represents a degree of superimposed usual interstitial pneumonitis. There is airspace opacity consistent with a degree of pneumonia in the right lower lobe. There are mosaic areas of attenuation throughout the lungs in part due to redistribution of blood flow to viable lung segments. There is probable degree also of small airways obstructive disease. There may be superimposed pneumonia in some of these areas as well. No consolidation evident. Atypical organism pneumonia is felt to be likely in this circumstance. 3. Sizable paraesophageal hernia with much of the stomach above the diaphragm. 4.  No evident adenopathy. 5. Prominence of the main pulmonary outflow tract is felt to be indicative of a degree of pulmonary arterial hypertension. Aortic Atherosclerosis (ICD10-I70.0) and Emphysema (ICD10-J43.9). Electronically Signed   By: Lowella Grip III M.D.   On: 03/10/2020 13:33   DG Chest Port 1 View  Result Date: 03/21/2020 CLINICAL DATA:  86 year old female positive for COVID-19. 3 weeks ago. Continued shortness of breath with activity. EXAM: PORTABLE CHEST 1 VIEW COMPARISON:  Portable chest 09/29/2016 and earlier. FINDINGS: Stable cardiomegaly and mediastinal contours. Stable large lung volumes. Coarse and indistinct widespread right lung and left perihilar and lower lung opacity is new since 2017. No superimposed pneumothorax or definite pleural effusion. Visualized tracheal air column is within normal limits. No acute osseous abnormality identified. Negative visible bowel gas pattern. IMPRESSION: Widespread right greater  than left indistinct and interstitial pulmonary opacity compatible with COVID-19 Pneumonia in this setting. No pleural effusion. Electronically Signed   By: Genevie Ann M.D.   On: 03/18/2020 11:07        Scheduled Meds: . albuterol  2 puff Inhalation Q6H  . amLODipine  5 mg Oral Daily  . apixaban  2.5 mg Oral BID  . vitamin C  500 mg Oral Daily  . calcium-vitamin D  2 tablet Oral Daily  . Chlorhexidine Gluconate Cloth  6 each Topical Daily  . cholecalciferol  2,000 Units Oral Daily  . dexamethasone (DECADRON) injection  6 mg Intravenous Q24H  . levothyroxine  50 mcg Oral Q0600  . metoprolol succinate  50 mg Oral Daily  . pantoprazole  40 mg Oral Daily  . sodium chloride flush  3 mL Intravenous Q12H  . zinc sulfate  220 mg Oral Daily   Continuous Infusions: .  sodium chloride    . remdesivir 100 mg in NS 100 mL Stopped (03/14/20 1315)     LOS: 2 days    Time spent: 32 mins     Wyvonnia Dusky, MD [facility] Pager [phone]  If 7PM-7AM, please contact night-coverage [email or web address] 03/15/2020, 8:21 AM

## 2020-03-15 NOTE — Progress Notes (Signed)
Initial Nutrition Assessment  DOCUMENTATION CODES:   Not applicable  INTERVENTION:   Ensure Enlive po TID, each supplement provides 350 kcal and 20 grams of protein  Magic cup TID with meals, each supplement provides 290 kcal and 9 grams of protein  MVI daily   Dysphagia 3 diet   Pt likely at moderate refeed risk  NUTRITION DIAGNOSIS:   Increased nutrient needs related to catabolic illness(COVID 19) as evidenced by increased estimated needs.  GOAL:   Patient will meet greater than or equal to 90% of their needs  MONITOR:   PO intake, Supplement acceptance, Labs, Weight trends, Skin, I & O's  REASON FOR ASSESSMENT:   Consult Assessment of nutrition requirement/status  ASSESSMENT:   86 y.o. female with medical history significant for hypertension, stage 4 chronic kidney disease and history of bladder cancer who is admitted with COVID 19 PNA  RD working remotely.  Unable to reach pt by phone. Suspect pt with poor appetite and oral intake pta r/t COVID 19. Per chart review, pt is eating some soup today and drank some Ensure. RD will add supplements and vitamins to help pt meet her estimated needs. RD will also change pt to a mechanical soft diet as pt with full dentures. Per chart, pt's UBW appears to be ~110-112lbs. Pt appears to have lost 4lbs(4%) over the past month; RD unsure how much weight loss is from dehydration.   Pt is at high risk for malnutrition but unable to diagnose at this time as NFPE cannot be performed.   Medications reviewed and include: vitamin C, oscal w/ D, D3, dexamethasone, synthroid, protonix, zinc  Labs reviewed: Na 128(L), BUN 32(H), creat 1.01(H), P 3.1 wnl, Mg 2.0 wnl  NUTRITION - FOCUSED PHYSICAL EXAM: Unable to perform at this time   Diet Order:   Diet Order            Diet 2 gram sodium Room service appropriate? Yes; Fluid consistency: Thin  Diet effective now             EDUCATION NEEDS:   No education needs have been  identified at this time  Skin:  Skin Assessment: Reviewed RN Assessment  Last BM:  5/5- TYPE 5  Height:   Ht Readings from Last 1 Encounters:  03/14/20 5\' 2"  (1.575 m)    Weight:   Wt Readings from Last 1 Encounters:  03/14/20 49 kg    Ideal Body Weight:  50 kg  BMI:  Body mass index is 19.76 kg/m.  Estimated Nutritional Needs:   Kcal:  1300-1500kcal/day  Protein:  70-80g/day  Fluid:  >1.2L/day  Koleen Distance MS, RD, LDN Please refer to [facility] for RD and/or RD on-call/weekend/after hours pager

## 2020-03-15 NOTE — Progress Notes (Signed)
Ch attempted visit with Pt as per recommendation by Ch.Waters. Pt was asleep at this time, and Ch will attempt visit another time.

## 2020-03-15 NOTE — Progress Notes (Signed)
Pts o2 sats noted to be decreasing into the 70s-80s, o2 sat monitor changed and to confirm o2 sat, wave form good on o2 sat monitor, pt o2 [place] increased to HFNC at 4L, o2 sat remained low in the 80s, RT Bob called and in route to pts room, HFNC increased to 10L sats remained in the mid to upper 80s, RT arrived, HFNC increased more to 12L and then to 15L o2 sats 86-88%, called NP Brenda-explained the above new order for STAT ABG-RT collecting, awaiting results of ABG for further instruction

## 2020-03-16 DIAGNOSIS — J1282 Pneumonia due to coronavirus disease 2019: Secondary | ICD-10-CM

## 2020-03-16 DIAGNOSIS — U071 COVID-19: Principal | ICD-10-CM

## 2020-03-16 DIAGNOSIS — Z7189 Other specified counseling: Secondary | ICD-10-CM

## 2020-03-16 DIAGNOSIS — Z515 Encounter for palliative care: Secondary | ICD-10-CM

## 2020-03-16 LAB — CBC WITH DIFFERENTIAL/PLATELET
Abs Immature Granulocytes: 0.13 10*3/uL — ABNORMAL HIGH (ref 0.00–0.07)
Basophils Absolute: 0 10*3/uL (ref 0.0–0.1)
Basophils Relative: 0 %
Eosinophils Absolute: 0 10*3/uL (ref 0.0–0.5)
Eosinophils Relative: 0 %
HCT: 39.5 % (ref 36.0–46.0)
Hemoglobin: 13.1 g/dL (ref 12.0–15.0)
Immature Granulocytes: 1 %
Lymphocytes Relative: 2 %
Lymphs Abs: 0.2 10*3/uL — ABNORMAL LOW (ref 0.7–4.0)
MCH: 26.1 pg (ref 26.0–34.0)
MCHC: 33.2 g/dL (ref 30.0–36.0)
MCV: 78.7 fL — ABNORMAL LOW (ref 80.0–100.0)
Monocytes Absolute: 0.3 10*3/uL (ref 0.1–1.0)
Monocytes Relative: 3 %
Neutro Abs: 9 10*3/uL — ABNORMAL HIGH (ref 1.7–7.7)
Neutrophils Relative %: 94 %
Platelets: 206 10*3/uL (ref 150–400)
RBC: 5.02 MIL/uL (ref 3.87–5.11)
RDW: 19.1 % — ABNORMAL HIGH (ref 11.5–15.5)
WBC: 9.6 10*3/uL (ref 4.0–10.5)
nRBC: 0 % (ref 0.0–0.2)

## 2020-03-16 LAB — COMPREHENSIVE METABOLIC PANEL
ALT: 7 U/L (ref 0–44)
AST: 21 U/L (ref 15–41)
Albumin: 2.8 g/dL — ABNORMAL LOW (ref 3.5–5.0)
Alkaline Phosphatase: 82 U/L (ref 38–126)
Anion gap: 9 (ref 5–15)
BUN: 51 mg/dL — ABNORMAL HIGH (ref 8–23)
CO2: 24 mmol/L (ref 22–32)
Calcium: 8.5 mg/dL — ABNORMAL LOW (ref 8.9–10.3)
Chloride: 100 mmol/L (ref 98–111)
Creatinine, Ser: 1.16 mg/dL — ABNORMAL HIGH (ref 0.44–1.00)
GFR calc Af Amer: 45 mL/min — ABNORMAL LOW (ref >60)
GFR calc non Af Amer: 39 mL/min — ABNORMAL LOW (ref >60)
Glucose, Bld: 118 mg/dL — ABNORMAL HIGH (ref 70–99)
Potassium: 4.5 mmol/L (ref 3.5–5.1)
Sodium: 133 mmol/L — ABNORMAL LOW (ref 135–145)
Total Bilirubin: 1 mg/dL (ref 0.3–1.2)
Total Protein: 6.2 g/dL — ABNORMAL LOW (ref 6.5–8.1)

## 2020-03-16 LAB — C-REACTIVE PROTEIN: CRP: 8 mg/dL — ABNORMAL HIGH (ref <1.0)

## 2020-03-16 LAB — FERRITIN: Ferritin: 129 ng/mL (ref 11–307)

## 2020-03-16 LAB — BRAIN NATRIURETIC PEPTIDE: B Natriuretic Peptide: 493 pg/mL — ABNORMAL HIGH (ref 0.0–100.0)

## 2020-03-16 LAB — FIBRIN DERIVATIVES D-DIMER (ARMC ONLY): Fibrin derivatives D-dimer (ARMC): 7403.84 ng/mL (FEU) — ABNORMAL HIGH (ref 0.00–499.00)

## 2020-03-16 LAB — MAGNESIUM: Magnesium: 2.3 mg/dL (ref 1.7–2.4)

## 2020-03-16 LAB — PHOSPHORUS: Phosphorus: 3.4 mg/dL (ref 2.5–4.6)

## 2020-03-16 MED ORDER — MORPHINE SULFATE (PF) 2 MG/ML IV SOLN
INTRAVENOUS | Status: AC
  Administered 2020-03-16: 22:00:00 1 mg via INTRAVENOUS
  Filled 2020-03-16: qty 1

## 2020-03-16 MED ORDER — MORPHINE SULFATE (PF) 2 MG/ML IV SOLN
1.0000 mg | INTRAVENOUS | Status: DC | PRN
  Administered 2020-03-16: 1 mg via INTRAVENOUS
  Administered 2020-03-17 (×2): 2 mg via INTRAVENOUS
  Filled 2020-03-16 (×3): qty 1

## 2020-03-16 MED ORDER — IVERMECTIN 3 MG PO TABS
150.0000 ug/kg | ORAL_TABLET | ORAL | Status: DC
  Filled 2020-03-16: qty 3

## 2020-03-16 MED ORDER — SODIUM CHLORIDE 0.9 % IV SOLN: INTRAVENOUS | Status: DC

## 2020-03-16 NOTE — Progress Notes (Signed)
Pt's daughter [name]), son in law, and granddaughter Tressia Miners) are at bedside talking with patient.

## 2020-03-16 NOTE — Progress Notes (Signed)
PROGRESS NOTE    Jody Mcclain  WEX:[id number] DOB: 1919-10-03 DOA: 04/04/2020 PCP: Glendon Axe, MD       Assessment & Plan:   Principal Problem:   Pneumonia due to COVID-19 virus Active Problems:   Hypothyroidism   Hypertension   GERD (gastroesophageal reflux disease)   Atrial fibrillation, chronic ([place])   Hyponatremia   Acute on chronic respiratory failure with hypoxia ([place])   COVID-19 pneumonia: dx on 02/29/20. Continue on decadron, remdesivir, vtamin c & zinc. Continue on supplemental oxygen and wean as tolerated. Continue on bronchodilators. Inflammatory markers are elevated. Encourage incentive spirometry & flutter valve. Worsened respiratory status overnight and this morning w/ increased oxygen demand. Also had hemoptysis. Pt was transferred to stepdown to start BiPAP. Pulmon consulted  Acute hypoxic respiratory failure: secondary to COVID-19 pneumonia. Worsening respiratory status as stated above. Continue on BiPAP and wean as tolerated. Pulmon consulted. Management as stated above  Leukopenia: resolved  Hypothyroidism: continue on levothyroxine   History of chronic atrial fibrillation: continue metoprolol for rate control & eliquis as primary prophylaxis for CVA  Hyponatremia: probably from poor oral intake. Continues to trend up    Poor po intake: continue on nutritional supplements as per nutrition   Generalized weakness: will hold off consulting PT/OT as respiratory status is tenuous    DVT prophylaxis: eliquis  Code Status: DNR Family Communication: discussed pt's care w/ pt's granddaughter, [facility], and answered her questions. Discussed comfort care w/ Helene Kelp but family wants to continue w/ above stated care, but still DNR/DNI. Pt's daughter, [name], evidently was threatening to bring a gun to the hospital so security was notified and evidently so were the police.  Disposition Plan: depends on PT/OT recs   Consultants:      Procedures:     Antimicrobials:   Status is: Inpatient  Remains inpatient appropriate because:Hemodynamically unstable   Dispo: The patient is from: Home              Anticipated d/c is to: Home vs home health vs SNF              Anticipated d/c date is: > 3 days              Patient currently is not medically stable to d/c.      Subjective: Pt c/o shortness of breath & coughing up blood.   Objective: Vitals:   03/16/20 0410 03/16/20 0447 03/16/20 0636 03/16/20 0645  BP:      Pulse: 77 77 90 61  Resp: 19 18 19 17   Temp:      TempSrc:      SpO2: 96% 97% 95% 95%  Weight:      Height:        Intake/Output Summary (Last 24 hours) at 03/16/2020 0744 Last data filed at 03/16/2020 9381 Gross per 24 hour  Intake 89.13 ml  Output 100 ml  Net -10.87 ml   Filed Weights   03/21/2020 1026 03/14/20 1339  Weight: 49.9 kg 49 kg    Examination:  General exam: Appears calm and comfortable. Frail appearing  Respiratory system: diminished breath sounds b/l. .  Cardiovascular system: S1 & S2 +. No rubs, gallops or clicks.  Gastrointestinal system: Abdomen is nondistended, soft and nontender. hypoactive bowel sounds heard. Central nervous system: Alert and oriented. Moves all 4 extremities  Psychiatry: Judgement and insight appear normal. Flat mood and affect      Data Reviewed: I have personally reviewed following labs and imaging  studies  CBC: Recent Labs  Lab 04/07/2020 1133 03/14/20 0448 03/15/20 0426 03/16/20 0534  WBC 6.1 3.1* 8.9 9.6  NEUTROABS 5.8 2.9 8.4* 9.0*  HGB 13.6 12.7 12.6 13.1  HCT 40.3 38.6 38.0 39.5  MCV 78.6* 79.1* 78.2* 78.7*  PLT 241 221 242 034   Basic Metabolic Panel: Recent Labs  Lab 03/20/2020 1133 03/14/20 0448 03/15/20 0426 03/16/20 0534  NA 124* 125* 128* 133*  K 4.1 3.8 4.3 4.5  CL 92* 95* 99 100  CO2 22 22 22 24   GLUCOSE 112* 124* 115* 118*  BUN 23 20 32* 51*  CREATININE 1.34* 0.99 1.01* 1.16*  CALCIUM 8.2* 8.1* 8.2* 8.5*  MG  --  1.7 2.0 2.3   PHOS  --  2.9 3.1 3.4   GFR: Estimated Creatinine Clearance: 20.9 mL/min (A) (by C-G formula based on SCr of 1.16 mg/dL (H)). Liver Function Tests: Recent Labs  Lab 03/19/2020 1133 03/14/20 0448 03/15/20 0426 03/16/20 0534  AST 26 24 22 21   ALT 8 9 8 7   ALKPHOS 68 66 67 82  BILITOT 1.0 1.0 0.9 1.0  PROT 6.6 6.0* 5.8* 6.2*  ALBUMIN 3.1* 2.6* 2.5* 2.8*   No results for input(s): LIPASE, AMYLASE in the last 168 hours. No results for input(s): AMMONIA in the last 168 hours. Coagulation Profile: No results for input(s): INR, PROTIME in the last 168 hours. Cardiac Enzymes: No results for input(s): CKTOTAL, CKMB, CKMBINDEX, TROPONINI in the last 168 hours. BNP (last 3 results) No results for input(s): PROBNP in the last 8760 hours. HbA1C: No results for input(s): HGBA1C in the last 72 hours. CBG: No results for input(s): GLUCAP in the last 168 hours. Lipid Profile: Recent Labs    03/14/2020 1133  TRIG 94   Thyroid Function Tests: No results for input(s): TSH, T4TOTAL, FREET4, T3FREE, THYROIDAB in the last 72 hours. Anemia Panel: Recent Labs    03/15/20 0426 03/16/20 0534  FERRITIN 144 129   Sepsis Labs: Recent Labs  Lab 03/30/2020 1133 03/14/2020 1337  PROCALCITON 0.17  --   LATICACIDVEN 2.8* 2.2*    Recent Results (from the past 240 hour(s))  Blood Culture (routine x 2)     Status: None (Preliminary result)   Collection Time: 04/07/2020 11:33 AM   Specimen: BLOOD  Result Value Ref Range Status   Specimen Description BLOOD LEFT FOREARM  Final   Special Requests   Final    BOTTLES DRAWN AEROBIC AND ANAEROBIC Blood Culture results may not be optimal due to an excessive volume of blood received in culture bottles   Culture   Final    NO GROWTH 3 DAYS Performed at [facility], [address] [place], [place] [address]    Report Status PENDING  Incomplete  Blood Culture (routine x 2)     Status: None (Preliminary result)   Collection Time: 03/24/2020 11:33 AM    Specimen: BLOOD  Result Value Ref Range Status   Specimen Description BLOOD LEFT [facility]  Final   Special Requests   Final    BOTTLES DRAWN AEROBIC AND ANAEROBIC Blood Culture adequate volume   Culture   Final    NO GROWTH 3 DAYS Performed at [facility], [address] [place], [place] [address]    Report Status PENDING  Incomplete         Radiology Studies: DG Chest 1 View  Result Date: 03/15/2020 CLINICAL DATA:  Acute on chronic respiratory failure EXAM: CHEST  1 VIEW COMPARISON:  Mar 13, 2020 FINDINGS: The heart size is stable. Again noted are acute on chronic interstitial lung markings similar to prior chest x-ray. There is no pneumothorax. There may be trace bilateral pleural effusions. There is no acute osseous abnormality. No new focal infiltrate. IMPRESSION: 1. Persistent widespread interstitial opacities similar to prior study. 2. Stable cardiac silhouette. Electronically Signed   By: Constance Holster M.D.   On: 03/15/2020 22:23        Scheduled Meds: . albuterol  2 puff Inhalation Q6H  . amLODipine  5 mg Oral Daily  . apixaban  2.5 mg Oral BID  . vitamin C  500 mg Oral Daily  . calcium-vitamin D  2 tablet Oral Daily  . Chlorhexidine Gluconate Cloth  6 each Topical Daily  . cholecalciferol  2,000 Units Oral Daily  . dexamethasone (DECADRON) injection  6 mg Intravenous Q24H  . docusate sodium  200 mg Oral BID  . feeding supplement (ENSURE ENLIVE)  237 mL Oral TID  . levothyroxine  50 mcg Oral Q0600  . metoprolol succinate  50 mg Oral Daily  . multivitamin with minerals  1 tablet Oral Daily  . pantoprazole  40 mg Oral Daily  . sodium chloride flush  3 mL Intravenous Q12H  . zinc sulfate  220 mg Oral Daily   Continuous Infusions: . sodium chloride    . remdesivir 100 mg in NS 100 mL Stopped (03/15/20 0944)     LOS: 3 days    Time spent: 35 mins     Wyvonnia Dusky, MD [facility] Pager [phone]  If 7PM-7AM, please contact  night-coverage [email or web address] 03/16/2020, 7:44 AM

## 2020-03-16 NOTE — Consult Note (Signed)
Consultation Note Date: 03/16/2020   Patient Name: Jody Mcclain  DOB: 1919/03/17  MRN: [id number]  Age / Sex: 86 y.o., female  PCP: Glendon Axe, MD Referring Physician: Wyvonnia Dusky, MD  Reason for Consultation: Establishing goals of care  HPI/Patient Profile: HPI: Jody Mcclain is a 86 y.o. female with medical history significant for hypertension, stage 4 chronic kidney disease and history of bladder cancer who was sent to the emergency room from [facility] clinic for evaluation of hypoxia.  Patient tested positive for Covid on 02/29/20.  Clinical Assessment and Goals of Care: Consulted on patient with current plans in place to D/C patient home with hospice. Patient is resting in bed. She is very weak and frail. RN at bedside. Patient speaks in short phrases or 1 word answers. She says "yes", she is ready to stop care and "yes" she is ready to focus on comfort for what time she has left, and "yes" she is ready to die and go to heaven. She says "it's okay" when asked if she would be okay with dying here in the hospital instead of at home.  Per nursing, high flow cannula was removed for a moment and patient desaturated to 40's. Per CCM and RN, it is felt patient would not live to get home with hospice and would die before she got home.    Called to speak with granddaughter who is HPOA Olivia Mackie. Discussed her grandmother's frailty and chance of death in transport.  Olivia Mackie states she wants what is best for her grandmother. She discusses [name], and the earlier threat of a gun. She states her grandmother needs to see [name] before she dies, and pleas for her to be able to come. Told her hospital administration can discuss this with her.   Discussed concern for her pain and suffering. She would like to transition to comfort care and would like to get her to hospice facility if possible so she can see [name] . She  understands her grandmother could die prior to getting there, and she states she will speak with her grandmother to see if she would want to take the risk and be transported out of the hospital.   Patient removed her mask and again desaturated to the high 30's. Plans for Olivia Mackie to come spend time with her grandmother, and to transition to comfort care once patient has been able to spend time with her family.   Spoke with hospice liaison Olivia Mackie, she states they no longer have a covid unit.         SUMMARY OF RECOMMENDATIONS    Plans to transition to comfort care once family has been able to spend time with family.    Prognosis:   Very poor      Primary Diagnoses: Present on Admission: . Pneumonia due to COVID-19 virus . Hypothyroidism . Hypertension . GERD (gastroesophageal reflux disease) . Atrial fibrillation, chronic ([place]) . Hyponatremia . Acute on chronic respiratory failure with hypoxia ([facility])   I have reviewed the medical record, interviewed  the patient and family, and examined the patient. The following aspects are pertinent.  Past Medical History:  Diagnosis Date  . Arthritis    back and joints  . 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 Chronic constipation   . CKD (chronic kidney disease), stage III    pt has been referred to nephrologist by PCP, appt. is later this month July 2016  . Full dentures   . Generalized weakness    bilateral legs  . GERD (gastroesophageal reflux disease)   . Hematuria   . History of hiatal hernia   . Hyperlipidemia   . Hypertension   . Hypothyroidism   . Lower extremity edema   . Malaise and fatigue   . OAB (overactive bladder)   . Renal neoplasm   . SUI (stress urinary incontinence, female)   . Tricuspid valve regurgitation, nonrheumatic cardiologist-- dr Saralyn Pilar   severe per cardiology note   Social History   Socioeconomic  History  . Marital status: Single    Spouse name: Not on file  . Number of children: Not on file  . Years of education: Not on file  . Highest education level: Not on file  Occupational History  . Not on file  Tobacco Use  . Smoking status: Former Smoker    Packs/day: 2.00    Years: 20.00    Pack years: 40.00    Types: Cigarettes    Quit date: 05/15/1968    Years since quitting: 51.8  . Smokeless tobacco: Never Used  Substance and Sexual Activity  . Alcohol use: No  . Drug use: No  . Sexual activity: Not on file  Other Topics Concern  . Not on file  Social History Narrative  . Not on file   Social Determinants of Health   Financial Resource Strain:   . Difficulty of Paying Living Expenses:   Food Insecurity:   . Worried About Charity fundraiser in the Last Year:   . Arboriculturist in the Last Year:   Transportation Needs:   . Film/video editor (Medical):   Marland Kitchen Lack of Transportation (Non-Medical):   Physical Activity:   . Days of Exercise per Week:   . Minutes of Exercise per Session:   Stress:   . Feeling of Stress :   Social Connections:   . Frequency of Communication with Friends and Family:   . Frequency of Social Gatherings with Friends and Family:   . Attends Religious Services:   . Active Member of Clubs or Organizations:   . Attends Archivist Meetings:   Marland Kitchen Marital Status:    Family History  Problem Relation Age of Onset  . Hypertension Other    Scheduled Meds: . albuterol  2 puff Inhalation Q6H  . amLODipine  5 mg Oral Daily  . vitamin C  500 mg Oral Daily  . calcium-vitamin D  2 tablet Oral Daily  . Chlorhexidine Gluconate Cloth  6 each Topical Daily  . cholecalciferol  2,000 Units Oral Daily  . dexamethasone (DECADRON) injection  6 mg Intravenous Q24H  . docusate sodium  200 mg Oral BID  . feeding supplement (ENSURE ENLIVE)  237 mL Oral TID  . ivermectin  150 mcg/kg Oral Q48H  . levothyroxine  50 mcg Oral Q0600  . metoprolol  succinate  50 mg Oral Daily  . multivitamin with minerals  1 tablet Oral  Daily  . pantoprazole  40 mg Oral Daily  . sodium chloride flush  3 mL Intravenous Q12H  . zinc sulfate  220 mg Oral Daily   Continuous Infusions: . sodium chloride    . sodium chloride Stopped (03/16/20 1257)  . remdesivir 100 mg in NS 100 mL Stopped (03/16/20 1151)   PRN Meds:.sodium chloride, acetaminophen, guaiFENesin-dextromethorphan, ondansetron **OR** ondansetron (ZOFRAN) IV, phenol, sodium chloride flush Medications Prior to Admission:  Prior to Admission medications   Medication Sig Start Date End Date Taking? Authorizing Provider  albuterol (VENTOLIN HFA) 108 (90 Base) MCG/ACT inhaler Inhale 1-2 puffs into the lungs every 6 (six) hours as needed for shortness of breath or wheezing. 03/02/20  Yes [provider]  amLODipine (NORVASC) 5 MG tablet Take 5 mg by mouth daily.    Yes [provider]  Calcium 500-100 MG-UNIT CHEW Chew 2 tablets by mouth daily.   Yes [provider]  Cholecalciferol (VITAMIN D) 2000 units CAPS Take 2,000 Units by mouth daily.    Yes [provider]  ELIQUIS 2.5 MG TABS tablet Take 2.5 mg by mouth 2 (two) times daily. 02/28/20  Yes [provider]  levothyroxine (SYNTHROID, LEVOTHROID) 50 MCG tablet Take 50 mcg by mouth daily before breakfast.   Yes [provider]  metoprolol succinate (TOPROL-XL) 50 MG 24 hr tablet Take 50 mg by mouth daily. 01/03/20  Yes [provider]  omeprazole (PRILOSEC) 40 MG capsule Take 40 mg by mouth every morning.    Yes [provider]   Allergies  Allergen Reactions  . Augmentin [Amoxicillin-Pot Clavulanate] Nausea Only  . Boniva [Ibandronic Acid] Other (See Comments)    unknown  . Ceftin [Cefuroxime] Other (See Comments)  . Fosamax [Alendronate] Other (See Comments)    unknown  . Lipitor [Atorvastatin] Other (See Comments)    MUSCLE PAIN  . Sulfa Antibiotics Nausea Only  .  Thiazide-Type Diuretics Other (See Comments)    unknown   Review of Systems  Constitutional: Positive for fatigue.    Physical Exam Pulmonary:     Comments: High flow cannula Neurological:     Mental Status: She is alert.     Vital Signs: BP 129/77 (BP Location: Right Arm)   Pulse 82   Temp 98 F (36.7 C) (Axillary)   Resp (!) 28   Ht 5' (1.524 m)   Wt 49 kg   SpO2 (!) 86%   BMI 21.10 kg/m  Pain Scale: 0-10   Pain Score: 0-No pain   SpO2: SpO2: (!) 86 % O2 Device:SpO2: (!) 86 % O2 Flow Rate: .O2 Flow Rate (L/min): 45 L/min  IO: Intake/output summary:   Intake/Output Summary (Last 24 hours) at 03/16/2020 1600 Last data filed at 03/16/2020 1319 Gross per 24 hour  Intake 277.76 ml  Output 100 ml  Net 177.76 ml    LBM: Last BM Date: 04/05/2020 Baseline Weight: Weight: 49.9 kg Most recent weight: Weight: 49 kg     Palliative Assessment/Data:    Greater than 50%  of this time was spent counseling and coordinating care related to the above assessment and plan. Discussed with CCM, RN, Agricultural consultant.   Time In: 2:50 Time Out: 4:00 Time Total: 70 min   Signed by: Asencion Gowda, NP   Please contact Palliative Medicine Team phone at [phone] for questions and concerns.  For individual provider: See Shea Evans

## 2020-03-16 NOTE — Progress Notes (Addendum)
   Nurse reports sudden decrease oxygen saturations with increasing oxygen needs from 4L [place] to HFNC, to NRB.  Resoirations non labored and not other changes. ABG with respiratory alkalosis and PO2 51 on H/fnc.  Chest xray  Without significant change per report.  Oxygen saturations I proved with NRB. Chest PT also ordered for suspected mucus plugging. Later in night, patient did cough up several thick bloody mucous secretions per RN report.  Noted patient is on eliquis and likely blood is from mucosal microbleeds but will monitor CBC Continuing plan to mobilize secretions and wean oxygen as able. POA granddaughter updated via phone

## 2020-03-16 NOTE — Progress Notes (Signed)
Granddaughter is at bedside with patient after talking with palliative care. Charge RN, Judson Roch, spoke with [facility] because granddaughter is requesting that the patient's daughter Jody Mcclain be able to come visit. AC says that [employer] does not think it was a viable threat from [place] and that she is able to come visit pt with escort of security while she is here.

## 2020-03-16 NOTE — Progress Notes (Signed)
Requested by nurse to see family members, met family in hallway outside ICU, very emotional crying. Chaplain listen as the shared patient's life stories. Chaplain offered emotional support, ministry of presence.

## 2020-03-16 NOTE — Progress Notes (Signed)
Shift report received from [place], [place]. Informed of overnight events. On assessment pt. o2 saturation is 88% maxed out on the non rebreather. Durring assessment o2 dropped to 79%. Dr. Jimmye Norman made aware. Verbal orders to continue with non rebreather and dr. Darnell Level. Is going to see patient after ICU rounds. Pt. o2 continues to fluctuate between 79 and 92% at rest with a good pleath. Patient does not appear to be in any distress, denies any pain at this time. Dr. Jimmye Norman providing update to family and adjusting care plan accordingly.

## 2020-03-16 NOTE — Progress Notes (Addendum)
CSW received a call from [facility] ([phone]) with [facility] who reported she received a call from patient's granddaughter/POA, Olivia Mackie, who is asking about transitioning patient home with hospice services.   Spoke with Dr. Jimmye Norman who reports this is an option if this is what patient and family want.   CSW spoke with RN Jinny Blossom who reports she has spoken with the patient and Olivia Mackie. Palliative has been consulted to talk with patient about her wishes since patient is alert and oriented x 4.   CSW called patient's granddaughter/POA Olivia Mackie. She confirmed they want patient involved in the decision process since patient is oriented, and was agreeable with CSW following up after Palliative has a discussion with patient. Informed her of agency options. She reported she has no experience with setting up hospice services and asked for CSW's recommendation. Informed her we are unable to give recommendations and deferred to [facility] website. Olivia Mackie reported she would like to use [facility] if this is the route patient decides to take. Olivia Mackie asked if she can be involved in the discussion with patient via speaker phone. CSW updated MD Jimmye Norman, RN Jinny Blossom, Palliative NP Crystal, and [facility] [employer].   2:50- Per MD and RN patient may not be stable enough to transition home due to oxygen needs and may be a hospital death. They are going to follow up with the patient and family about this.   CSW updated Melissa with Hospice. Melissa requested a copy of Face Sheet in case patient does transfer home with hospice. Information is being faxed.   4:20- Spoke with RN Megan who said Olivia Mackie is here talking with Palliative and patient. Palliative NP told Olivia Mackie risks of transporting patient home with hospice and family is deciding on what they would like to do. CSW left EMS Paperwork on chart in case they do choose home with hospice this evening. RN Megan aware and will call Melissa with [facility] and EMS for transport if patient does discharge home with hospice this evening after 5 pm.   Oleh Genin, [place]

## 2020-03-16 NOTE — Progress Notes (Signed)
Pt arrived on unit at this time into RM 13. Pt is covid positive and A&OX4. Pt placed on heated high flow at 100%. All VSS at this time. Dr. Patsey Berthold is also at bedside.

## 2020-03-16 NOTE — Consult Note (Addendum)
Reason for Consult: Hemoptysis, COVID-19 Pneumonia Referring Physician: Eppie Gibson, MD  Jody Mcclain is an 86 y.o. female   HPI:  Patient is a 86 year old woman diagnosed with COVID-19 on 29 February 2020 initially treated with antibiotics and steroids as an outpatient.  She presented to [facility] ED on 4 May because of the hypoxia.  She was noted to have oxygen saturations of 86% on admission requiring 5 L of supplemental oxygen to maintain adequate oxygenation.  The patient was started on remdesivir and antibiotics in the emergency room.  She was admitted for further management.  Patient has had increasing requirements of oxygen despite management with remdesivir and steroids.  Today she had a sudden decrease in oxygen saturations and was noted to be having hemoptysis.  She was transferred to the stepdown for potential use of BiPAP.  She is DNR.  Of note the patient has been on Eliquis and she has acute on chronic renal failure superimposed on stage III kidney disease.  History is obtained from records as the patient is pretty much nonverbal on arrival to the stepdown unit.  We are asked to render opinion and assistance as to management of hemoptysis and worsening respiratory failure.   Past Medical History:  Diagnosis Date  . Arthritis    back and joints  . 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 Chronic constipation   . CKD (chronic kidney disease), stage III    pt has been referred to nephrologist by PCP, appt. is later this month July 2016  . Full dentures   . Generalized weakness    bilateral legs  . GERD (gastroesophageal reflux disease)   . Hematuria   . History of hiatal hernia   . Hyperlipidemia   . Hypertension   . Hypothyroidism   . Lower extremity edema   . Malaise and fatigue   . OAB (overactive bladder)   . Renal neoplasm   . SUI (stress urinary incontinence, female)   .  Tricuspid valve regurgitation, nonrheumatic cardiologist-- dr Saralyn Pilar   severe per cardiology note    Past Surgical History:  Procedure Laterality Date  . CATARACT EXTRACTION W/ INTRAOCULAR LENS  IMPLANT, BILATERAL  2001  . CYSTOSCOPY WITH BIOPSY N/A 07/06/2015   Procedure: CYSTOSCOPY WITH BLADDER BIOPSY;  Surgeon: Kathie Rhodes, MD;  Location: [facility];  Service: Urology;  Laterality: N/A;  . HIP PINNING,CANNULATED Right 09/30/2016   Procedure: CANNULATED HIP PINNING;  Surgeon: Dereck Leep, MD;  Location: [facility];  Service: Orthopedics;  Laterality: Right;  . TRANSTHORACIC ECHOCARDIOGRAM  04-06-2013   dr Sheppard Coil paraschos   mild to moderate LVH,  ef 60%/  moderate AI , valve area 2.6cm^2/  moderate to severe TI/  mild MI  . TRANSURETHRAL RESECTION OF BLADDER TUMOR WITH GYRUS (TURBT-GYRUS) N/A 05/18/2015   Procedure: TRANSURETHRAL RESECTION OF BLADDER TUMOR WITH GYRUS (TURBT-GYRUS);  Surgeon: Kathie Rhodes, MD;  [facility];  Service: Urology;  Laterality: N/A;  . VAGINAL HYSTERECTOMY  1969    Family History  Problem Relation Age of Onset  . Hypertension Other     Social History:  reports that she quit smoking about 51 years ago. Her smoking use included cigarettes. She has a 40.00 pack-year smoking history. She has never used smokeless tobacco. She reports that she does not drink alcohol or use drugs.  Allergies:  Allergies  Allergen Reactions  .  Augmentin [Amoxicillin-Pot Clavulanate] Nausea Only  . Boniva [Ibandronic Acid] Other (See Comments)    unknown  . Ceftin [Cefuroxime] Other (See Comments)  . Fosamax [Alendronate] Other (See Comments)    unknown  . Lipitor [Atorvastatin] Other (See Comments)    MUSCLE PAIN  . Sulfa Antibiotics Nausea Only  . Thiazide-Type Diuretics Other (See Comments)    unknown    Medications: I have reviewed the patient's current medications.  Results for orders placed or performed during the  hospital encounter of 03/30/2020 (from the past 48 hour(s))  CBC with Differential/Platelet     Status: Abnormal   Collection Time: 03/15/20  4:26 AM  Result Value Ref Range   WBC 8.9 4.0 - 10.5 K/uL   RBC 4.86 3.87 - 5.11 MIL/uL   Hemoglobin 12.6 12.0 - 15.0 g/dL   HCT 38.0 36.0 - 46.0 %   MCV 78.2 (L) 80.0 - 100.0 fL   MCH 25.9 (L) 26.0 - 34.0 pg   MCHC 33.2 30.0 - 36.0 g/dL   RDW 18.6 (H) 11.5 - 15.5 %   Platelets 242 150 - 400 K/uL   nRBC 0.0 0.0 - 0.2 %   Neutrophils Relative % 94 %   Neutro Abs 8.4 (H) 1.7 - 7.7 K/uL   Lymphocytes Relative 2 %   Lymphs Abs 0.2 (L) 0.7 - 4.0 K/uL   Monocytes Relative 3 %   Monocytes Absolute 0.3 0.1 - 1.0 K/uL   Eosinophils Relative 0 %   Eosinophils Absolute 0.0 0.0 - 0.5 K/uL   Basophils Relative 0 %   Basophils Absolute 0.0 0.0 - 0.1 K/uL   Immature Granulocytes 1 %   Abs Immature Granulocytes 0.07 0.00 - 0.07 K/uL    Comment: Performed at [facility], [address] [place], [place] [address]  Comprehensive metabolic panel     Status: Abnormal   Collection Time: 03/15/20  4:26 AM  Result Value Ref Range   Sodium 128 (L) 135 - 145 mmol/L   Potassium 4.3 3.5 - 5.1 mmol/L   Chloride 99 98 - 111 mmol/L   CO2 22 22 - 32 mmol/L   Glucose, Bld 115 (H) 70 - 99 mg/dL    Comment: Glucose reference range applies only to samples taken after fasting for at least 8 hours.   BUN 32 (H) 8 - 23 mg/dL   Creatinine, Ser 1.01 (H) 0.44 - 1.00 mg/dL   Calcium 8.2 (L) 8.9 - 10.3 mg/dL   Total Protein 5.8 (L) 6.5 - 8.1 g/dL   Albumin 2.5 (L) 3.5 - 5.0 g/dL   AST 22 15 - 41 U/L   ALT 8 0 - 44 U/L   Alkaline Phosphatase 67 38 - 126 U/L   Total Bilirubin 0.9 0.3 - 1.2 mg/dL   GFR calc non Af Amer 46 (L) >60 mL/min   GFR calc Af Amer 54 (L) >60 mL/min   Anion gap 7 5 - 15    Comment: Performed at [facility], [address] [place], [place] [address]  C-reactive protein     Status: Abnormal   Collection Time: 03/15/20  4:26 AM   Result Value Ref Range   CRP 9.3 (H) <1.0 mg/dL    Comment: Performed at [facility], [address], [place],  [address]  Fibrin derivatives D-Dimer [facility] only)     Status: Abnormal   Collection Time: 03/15/20  4:26 AM  Result Value Ref Range   Fibrin derivatives D-dimer [facility]) [phone] (  H) 0.00 - 499.00 ng/mL (FEU)    Comment: (NOTE) <> Exclusion of Venous Thromboembolism (VTE) - OUTPATIENT ONLY   (Emergency Department or [place])   0-499 ng/ml (FEU): With a low to intermediate pretest probability                      for VTE this test result excludes the diagnosis                      of VTE.   >499 ng/ml (FEU) : VTE not excluded; additional work up for VTE is                      required. <> Testing on Inpatients and Evaluation of Disseminated Intravascular   Coagulation (DIC) Reference Range:   0-499 ng/ml (FEU) Performed at [facility], [address] [place], [place] [address]   Ferritin     Status: None   Collection Time: 03/15/20  4:26 AM  Result Value Ref Range   Ferritin 144 11 - 307 ng/mL    Comment: Performed at [facility], [address] [place], [place] [address]  Magnesium     Status: None   Collection Time: 03/15/20  4:26 AM  Result Value Ref Range   Magnesium 2.0 1.7 - 2.4 mg/dL    Comment: Performed at [facility], [address] [place], [place] [address]  Phosphorus     Status: None   Collection Time: 03/15/20  4:26 AM  Result Value Ref Range   Phosphorus 3.1 2.5 - 4.6 mg/dL    Comment: Performed at [facility], [address] [place], [place] [address]  Blood gas, arterial     Status: Abnormal (Preliminary result)   Collection Time: 03/15/20  9:56 PM  Result Value Ref Range   FIO2 PENDING    Delivery systems HFNC 15 LPM    pH, Arterial 7.48 (H) 7.350 - 7.450   pCO2 arterial 29 (L) 32.0 - 48.0 mmHg   pO2, Arterial 51 (L) 83.0 - 108.0 mmHg   Bicarbonate 21.6 20.0 - 28.0 mmol/L   Acid-base  deficit 0.8 0.0 - 2.0 mmol/L   O2 Saturation 88.5 %   Patient temperature 37.0    Collection site REVIEWED BY    Sample type ARTERIAL DRAW    Allens test (pass/fail) NA PASS    Comment: Performed at [facility], [address] [place], [place] [address]   Mechanical Rate PENDING   CBC with Differential/Platelet     Status: Abnormal   Collection Time: 03/16/20  5:34 AM  Result Value Ref Range   WBC 9.6 4.0 - 10.5 K/uL   RBC 5.02 3.87 - 5.11 MIL/uL   Hemoglobin 13.1 12.0 - 15.0 g/dL   HCT 39.5 36.0 - 46.0 %   MCV 78.7 (L) 80.0 - 100.0 fL   MCH 26.1 26.0 - 34.0 pg   MCHC 33.2 30.0 - 36.0 g/dL   RDW 19.1 (H) 11.5 - 15.5 %   Platelets 206 150 - 400 K/uL   nRBC 0.0 0.0 - 0.2 %   Neutrophils Relative % 94 %   Neutro Abs 9.0 (H) 1.7 - 7.7 K/uL   Lymphocytes Relative 2 %   Lymphs Abs 0.2 (L) 0.7 - 4.0 K/uL   Monocytes Relative 3 %   Monocytes Absolute 0.3 0.1 - 1.0 K/uL   Eosinophils Relative 0 %   Eosinophils Absolute 0.0 0.0 -  0.5 K/uL   Basophils Relative 0 %   Basophils Absolute 0.0 0.0 - 0.1 K/uL   Immature Granulocytes 1 %   Abs Immature Granulocytes 0.13 (H) 0.00 - 0.07 K/uL    Comment: Performed at [facility], [address] [place], [place] [address]  Comprehensive metabolic panel     Status: Abnormal   Collection Time: 03/16/20  5:34 AM  Result Value Ref Range   Sodium 133 (L) 135 - 145 mmol/L   Potassium 4.5 3.5 - 5.1 mmol/L   Chloride 100 98 - 111 mmol/L   CO2 24 22 - 32 mmol/L   Glucose, Bld 118 (H) 70 - 99 mg/dL    Comment: Glucose reference range applies only to samples taken after fasting for at least 8 hours.   BUN 51 (H) 8 - 23 mg/dL   Creatinine, Ser 1.16 (H) 0.44 - 1.00 mg/dL   Calcium 8.5 (L) 8.9 - 10.3 mg/dL   Total Protein 6.2 (L) 6.5 - 8.1 g/dL   Albumin 2.8 (L) 3.5 - 5.0 g/dL   AST 21 15 - 41 U/L   ALT 7 0 - 44 U/L   Alkaline Phosphatase 82 38 - 126 U/L   Total Bilirubin 1.0 0.3 - 1.2 mg/dL   GFR calc non Af Amer 39 (L) >60  mL/min   GFR calc Af Amer 45 (L) >60 mL/min   Anion gap 9 5 - 15    Comment: Performed at [facility], [address] [place], [place] [address]  C-reactive protein     Status: Abnormal   Collection Time: 03/16/20  5:34 AM  Result Value Ref Range   CRP 8.0 (H) <1.0 mg/dL    Comment: Performed at [facility] [address] [place], [place] [address]  Fibrin derivatives D-Dimer [facility] only)     Status: Abnormal   Collection Time: 03/16/20  5:34 AM  Result Value Ref Range   Fibrin derivatives D-dimer ([facility]) 7,403.84 (H) 0.00 - 499.00 ng/mL (FEU)    Comment: (NOTE) <> Exclusion of Venous Thromboembolism (VTE) - OUTPATIENT ONLY   (Emergency Department or [place])   0-499 ng/ml (FEU): With a low to intermediate pretest probability                      for VTE this test result excludes the diagnosis                      of VTE.   >499 ng/ml (FEU) : VTE not excluded; additional work up for VTE is                      required. <> Testing on Inpatients and Evaluation of Disseminated Intravascular   Coagulation (DIC) Reference Range:   0-499 ng/ml (FEU) Performed at [facility], [address] [place], [place] [address]   Ferritin     Status: None   Collection Time: 03/16/20  5:34 AM  Result Value Ref Range   Ferritin 129 11 - 307 ng/mL    Comment: Performed at [facility], [address], [place], [place] [address]  Magnesium     Status: None   Collection Time: 03/16/20  5:34 AM  Result Value Ref Range   Magnesium 2.3 1.7 - 2.4 mg/dL    Comment: Performed at [facility], [address], [place], [place] [address]  Phosphorus     Status: None  Collection Time: 03/16/20  5:34 AM  Result Value Ref Range   Phosphorus 3.4 2.5 - 4.6 mg/dL    Comment: Performed at [facility], [address] [place], [place] [address]  Brain natriuretic peptide     Status: Abnormal   Collection Time: 03/16/20  5:34 AM  Result Value Ref Range    B Natriuretic Peptide 493.0 (H) 0.0 - 100.0 pg/mL    Comment: Performed at [facility], [address] [place], [place] [address]    DG Chest 1 View  Result Date: 03/15/2020 CLINICAL DATA:  Acute on chronic respiratory failure EXAM: CHEST  1 VIEW COMPARISON:  Mar 13, 2020 FINDINGS: The heart size is stable. Again noted are acute on chronic interstitial lung markings similar to prior chest x-ray. There is no pneumothorax. There may be trace bilateral pleural effusions. There is no acute osseous abnormality. No new focal infiltrate. IMPRESSION: 1. Persistent widespread interstitial opacities similar to prior study. 2. Stable cardiac silhouette. Electronically Signed   By: Constance Holster M.D.   On: 03/15/2020 22:23    Review of Systems  Unable to perform ROS: Acuity of condition   Blood pressure (!) 107/51, pulse 69, temperature 97.7 F (36.5 C), temperature source Axillary, resp. rate (!) 25, height 5' (1.524 m), weight 49 kg, SpO2 (!) 81 %. Physical Exam GENERAL: Thin almost cachectic woman, tachypneic, looks uncomfortable, nonverbal for the most part. HEAD: Normocephalic, atraumatic.  Bitemporal wasting. EYES: Pupils equal, round, reactive to light.  No scleral icterus.  MOUTH: Edentulous, dry oral mucosa, dried blood clots present NECK: Supple. No thyromegaly.  Trachea midline. + JVD.  PULMONARY: Difficult to assess due to PPE/CAPR.  She is tachypneic. CARDIOVASCULAR: Monitor shows sinus rhythm. GASTROINTESTINAL: Scaphoid abdomen, nondistended.  Soft.  No tenderness elicited. MUSCULOSKELETAL: No joint deformity, no clubbing, no edema.  NEUROLOGIC: Lethargic, nonverbal.  No overt focal deficit. SKIN: Intact,warm,dry. PSYCH: Unable to assess patient nonverbal.  Assessment/Plan:  Acute respiratory failure with hypoxia due to COVID-19 COVID-19 pneumonia Hemoptysis (infectious process+ anticoag) Heated high flow O2  Titrate for saturations of 88% or better Avoid BiPAP  given hemoptysis and potential for aspiration Continue remdesivir Continue steroids Add ivermectin Discontinue Eliquis (hemoptysis+worsening renal failure) Bronchoscopy to be avoided with active COVID-19 infection and severe hypoxia Prognosis poor in this regard Palliative Care consultation  Acute on chronic renal failure/ATN  Chronic kidney disease stage III Avoid nephrotoxic medications IV fluid supplementation while unable to take p.o. reliably Monitor urine output No indication for dialysis Ultimately not a candidate for dialysis  Advanced age and frailty Protein calorie malnutrition Catabolic state from [id number] These issues add complexity to her management Advance nutrition if and when patient able to take p.o.'s reliably   Patient was discussed during multidisciplinary rounds.  Have discussed with Palliative Care.  Recommend addressing goals of care.  Consider transitioning to comfort measures if fails to show response to conservative measures.  Overall prognosis is poor.    Renold Don, MD [facility] 03/16/2020, 1:26 PM    *This note was dictated using voice recognition software/Dragon.  Despite best efforts to proofread, errors can occur which can change the meaning.  Any change was purely unintentional.

## 2020-03-16 NOTE — Progress Notes (Signed)
Pt's granddaughter, Tressia Miners, is in agreeance to transition patient to comfort care. However, she wants family to be able to come see patient before taking her off of the heated high flow O2. Some family may not be able to make it to see her tonight but can come in the morning. Will take pt off heated high flow and stop all meds/fluids when family is ready in the morning so they can be with her if she passes quickly. Tressia Miners is aware that if anything happens overnight, we will just make patient comfortable and allow her to pass and she is okay and understanding of this. Dr. Patsey Berthold is aware of plan.

## 2020-03-16 NOTE — Progress Notes (Signed)
Randol Kern NP made aware that pt is coughing up very thick red bloody mucus

## 2020-03-17 MED ORDER — GLYCOPYRROLATE 0.2 MG/ML IJ SOLN: 0.4000 mg | INTRAMUSCULAR | Status: DC | PRN

## 2020-03-17 MED ORDER — LORAZEPAM 2 MG/ML IJ SOLN
2.0000 mg | INTRAMUSCULAR | Status: DC | PRN
  Filled 2020-03-17: qty 2

## 2020-03-17 MED ORDER — MORPHINE 100MG IN NS 100ML (1MG/ML) PREMIX INFUSION
0.0000 mg/h | INTRAVENOUS | Status: DC
  Administered 2020-03-17: 2 mg/h via INTRAVENOUS
  Filled 2020-03-17: qty 100

## 2020-03-18 LAB — CULTURE, BLOOD (ROUTINE X 2)
Culture: NO GROWTH
Culture: NO GROWTH
Special Requests: ADEQUATE

## 2020-03-19 LAB — GLUCOSE, CAPILLARY: Glucose-Capillary: 98 mg/dL (ref 70–99)

## 2020-04-10 NOTE — Significant Event (Signed)
Pt has declined over the last couple hours with increased work of breathing and worsening Hypoxia despite prn morphine IV pushes and HFNC.  Plan previously was to transition to comfort care measures once all family had arrived.  However given pt's decline, pt's granddaughter Jody Mcclain has come to bedside, and wishes to proceed with transition to comfort care measures now.  Will place on morphine drip. COMFORT MEASURES ONLY DNR/DNI.   Darel Hong, AGACNP-BC [facility] Pager: [phone]

## 2020-04-10 NOTE — Death Summary Note (Signed)
DEATH SUMMARY   Patient Details  Name: Jody Mcclain MRN: [id number] DOB: 04-Dec-1918  Admission/Discharge Information   Admit Date:  04-02-2020  Date of Death: Date of Death: 2020-04-06  Time of Death: Time of Death: 0645  Length of Stay: 4  Referring Physician: Glendon Axe, MD   Reason(s) for Hospitalization  Acute Hypoxic Respiratory Failure COVID-19 Pneumonia Hemoptysis AKI  Diagnoses  Preliminary cause of death:  Secondary Diagnoses (including complications and co-morbidities):  Principal Problem:   Pneumonia due to COVID-19 virus Active Problems:   Hypothyroidism   Hypertension   GERD (gastroesophageal reflux disease)   Atrial fibrillation, chronic ([place])   Hyponatremia   Acute on chronic respiratory failure with hypoxia [facility]   [facility] Course (including significant findings, care, treatment, and services provided and events leading to death)  Jody Mcclain is a 86 y.o. year old female who was diagnosed with COVID-19 on February 29, 2020 initially treated with antibiotics and steroids as an outpatient.  She presented to [facility] ED on 2020-04-02 because of  hypoxia.  She was noted to have oxygen saturations of 86% on admission requiring 5 L of supplemental oxygen to maintain adequate oxygenation.  The patient was started on remdesivir and antibiotics in the emergency room.  She was admitted for further management.   On 03/16/20 Patient has had increasing requirements of oxygen despite management with remdesivir and steroids.  Today she had a sudden decrease in oxygen saturations and was noted to be having hemoptysis.  She was transferred to the stepdown for potential use of BiPAP.  She is DNR.  Of note the patient has been on Eliquis and she has acute on chronic renal failure superimposed on stage III kidney disease.  After meeting with PCCM and Palliative care, plan was to transition to comfort care once all family arrived.  However, early in the morning on 04-06-2020 she  began to decline with increased work of breathing and worsening hypoxia.  Family was called to bedside and she was transitioned to comfort care, and placed on Morphine drip.   She passed shortly after transition to comfort care.   Pertinent Labs and Studies  Significant Diagnostic Studies DG Chest 1 View  Result Date: 03/15/2020 CLINICAL DATA:  Acute on chronic respiratory failure EXAM: CHEST  1 VIEW COMPARISON:  04-02-20 FINDINGS: The heart size is stable. Again noted are acute on chronic interstitial lung markings similar to prior chest x-ray. There is no pneumothorax. There may be trace bilateral pleural effusions. There is no acute osseous abnormality. No new focal infiltrate. IMPRESSION: 1. Persistent widespread interstitial opacities similar to prior study. 2. Stable cardiac silhouette. Electronically Signed   By: Constance Holster M.D.   On: 03/15/2020 22:23   CT Angio Chest PE W and/or Wo Contrast  Result Date: Apr 02, 2020 CLINICAL DATA:  Shortness of breath.  Recent COVID-19 positive EXAM: CT ANGIOGRAPHY CHEST WITH CONTRAST TECHNIQUE: Multidetector CT imaging of the chest was performed using the standard protocol during bolus administration of intravenous contrast. Multiplanar CT image reconstructions and MIPs were obtained to evaluate the vascular anatomy. CONTRAST:  38mL OMNIPAQUE IOHEXOL 350 MG/ML SOLN COMPARISON:  Chest radiograph Apr 02, 2020 FINDINGS: Cardiovascular: There is no demonstrable pulmonary embolus. There is no appreciable thoracic aortic aneurysm. No dissection is seen; note that the contrast bolus in the aorta is less than optimal for potential dissection assessment. Visualized great vessels show scattered foci of atherosclerotic calcification. There is aortic atherosclerosis. There are multiple foci of  coronary artery calcification. There is no pericardial effusion or pericardial thickening. Heart is mildly enlarged in a generalized manner. There is prominence of the main  pulmonary outflow tract measuring 3.4 cm. Mediastinum/Nodes: Thyroid appears unremarkable. There is no appreciable thoracic adenopathy. There is a large paraesophageal hernia with much of the stomach above the diaphragm. Lungs/Pleura: There is underlying centrilobular emphysematous change. There is bullous disease in the left lower lobe. There is fibrosis throughout the lower lobe regions with traction bronchiectatic change in each lower lobe. There is a somewhat mosaic attenuation throughout the lungs. There is airspace opacity in the right lower lobe consistent with a degree of pneumonia. Other areas of somewhat ill-defined opacity are superimposed on apparent fibrosis. Upper Abdomen: There is upper abdominal aortic atherosclerosis. There is calcification in multiple visualized upper abdominal mesenteric arterial vessels. Visualized upper abdominal structures otherwise appear unremarkable. Musculoskeletal: There is anterior wedging of the T1 vertebral body. There are no blastic or lytic bone lesions. No chest wall lesions evident. Review of the MIP images confirms the above findings. IMPRESSION: 1. No demonstrable pulmonary embolus. No thoracic aortic aneurysm. No dissection is with note that the contrast bolus within the aorta is not sufficient for confident dissection assessment. There are foci of aortic atherosclerosis as well as foci of great vessel and coronary artery calcification. Mild cardiac enlargement in a generalized manner. 2. Underlying centrilobular emphysematous change. Fibrosis in the lung bases with traction bronchiectasis likely represents a degree of superimposed usual interstitial pneumonitis. There is airspace opacity consistent with a degree of pneumonia in the right lower lobe. There are mosaic areas of attenuation throughout the lungs in part due to redistribution of blood flow to viable lung segments. There is probable degree also of small airways obstructive disease. There may be  superimposed pneumonia in some of these areas as well. No consolidation evident. Atypical organism pneumonia is felt to be likely in this circumstance. 3. Sizable paraesophageal hernia with much of the stomach above the diaphragm. 4.  No evident adenopathy. 5. Prominence of the main pulmonary outflow tract is felt to be indicative of a degree of pulmonary arterial hypertension. Aortic Atherosclerosis (ICD10-I70.0) and Emphysema (ICD10-J43.9). Electronically Signed   By: Lowella Grip III M.D.   On: 03/14/2020 13:33   DG Chest Port 1 View  Result Date: 03/30/2020 CLINICAL DATA:  86 year old female positive for COVID-19. 3 weeks ago. Continued shortness of breath with activity. EXAM: PORTABLE CHEST 1 VIEW COMPARISON:  Portable chest 09/29/2016 and earlier. FINDINGS: Stable cardiomegaly and mediastinal contours. Stable large lung volumes. Coarse and indistinct widespread right lung and left perihilar and lower lung opacity is new since 2017. No superimposed pneumothorax or definite pleural effusion. Visualized tracheal air column is within normal limits. No acute osseous abnormality identified. Negative visible bowel gas pattern. IMPRESSION: Widespread right greater than left indistinct and interstitial pulmonary opacity compatible with COVID-19 Pneumonia in this setting. No pleural effusion. Electronically Signed   By: Genevie Ann M.D.   On: 03/24/2020 11:07    Microbiology Recent Results (from the past 240 hour(s))  Blood Culture (routine x 2)     Status: None (Preliminary result)   Collection Time: 04/07/2020 11:33 AM   Specimen: BLOOD  Result Value Ref Range Status   Specimen Description BLOOD LEFT FOREARM  Final   Special Requests   Final    BOTTLES DRAWN AEROBIC AND ANAEROBIC Blood Culture results may not be optimal due to an excessive volume of blood received in culture bottles  Culture   Final    NO GROWTH 4 DAYS Performed at [facility], [address] [place], [place] [address]     Report Status PENDING  Incomplete  Blood Culture (routine x 2)     Status: None (Preliminary result)   Collection Time: 04/04/2020 11:33 AM   Specimen: BLOOD  Result Value Ref Range Status   Specimen Description BLOOD LEFT AC  Final   Special Requests   Final    BOTTLES DRAWN AEROBIC AND ANAEROBIC Blood Culture adequate volume   Culture   Final    NO GROWTH 4 DAYS Performed at [facility], [address], [place],  [address]    Report Status PENDING  Incomplete    Lab Basic Metabolic Panel: Recent Labs  Lab 04/05/2020 1133 03/14/20 0448 03/15/20 0426 03/16/20 0534  NA 124* 125* 128* 133*  K 4.1 3.8 4.3 4.5  CL 92* 95* 99 100  CO2 22 22 22 24   GLUCOSE 112* 124* 115* 118*  BUN 23 20 32* 51*  CREATININE 1.34* 0.99 1.01* 1.16*  CALCIUM 8.2* 8.1* 8.2* 8.5*  MG  --  1.7 2.0 2.3  PHOS  --  2.9 3.1 3.4   Liver Function Tests: Recent Labs  Lab 03/11/2020 1133 03/14/20 0448 03/15/20 0426 03/16/20 0534  AST 26 24 22 21   ALT 8 9 8 7   ALKPHOS 68 66 67 82  BILITOT 1.0 1.0 0.9 1.0  PROT 6.6 6.0* 5.8* 6.2*  ALBUMIN 3.1* 2.6* 2.5* 2.8*   No results for input(s): LIPASE, AMYLASE in the last 168 hours. No results for input(s): AMMONIA in the last 168 hours. CBC: Recent Labs  Lab 04/08/2020 1133 03/14/20 0448 03/15/20 0426 03/16/20 0534  WBC 6.1 3.1* 8.9 9.6  NEUTROABS 5.8 2.9 8.4* 9.0*  HGB 13.6 12.7 12.6 13.1  HCT 40.3 38.6 38.0 39.5  MCV 78.6* 79.1* 78.2* 78.7*  PLT 241 221 242 206   Cardiac Enzymes: No results for input(s): CKTOTAL, CKMB, CKMBINDEX, TROPONINI in the last 168 hours. Sepsis Labs: Recent Labs  Lab 03/26/2020 1133 03/20/2020 1337 03/14/20 0448 03/15/20 0426 03/16/20 0534  PROCALCITON 0.17  --   --   --   --   WBC 6.1  --  3.1* 8.9 9.6  LATICACIDVEN 2.8* 2.2*  --   --   --     Procedures/Operations  N/A    Darel Hong, AGACNP-BC [facility] Pager: [phone]  Bradly Bienenstock 12-Apr-2020,  7:10 AM

## 2020-04-10 NOTE — Progress Notes (Signed)
Patient declining over the night, [employer] notified. Olivia Mackie wishes to proceed with comfort care measures. Morphine gtt initiated, placed on 2L [place] for comfort. Patient in no distress, family at bedside.   0645 Time of death, verified by this RN and Manuella Ghazi, RN.

## 2020-04-10 DEATH — deceased

## 2020-04-12 LAB — BLOOD GAS, ARTERIAL
Acid-base deficit: 0.8 mmol/L (ref 0.0–2.0)
Bicarbonate: 21.6 mmol/L (ref 20.0–28.0)
O2 Saturation: 88.5 %
Patient temperature: 37
pCO2 arterial: 29 mmHg — ABNORMAL LOW (ref 32.0–48.0)
pH, Arterial: 7.48 — ABNORMAL HIGH (ref 7.350–7.450)
pO2, Arterial: 51 mmHg — ABNORMAL LOW (ref 83.0–108.0)

## 2020-10-17 IMAGING — CT CT ANGIO CHEST
2 of 6 series · 17 of 46 positions shown · IV contrast (APPLIED)
Comparison: Chest radiograph March 13, 2020

CLINICAL DATA: Shortness of breath.  Recent [id number] positive

EXAM:
CT ANGIOGRAPHY CHEST WITH CONTRAST
TECHNIQUE: Multidetector CT imaging of the chest was performed using the
standard protocol during bolus administration of intravenous
contrast. Multiplanar CT image reconstructions and MIPs were
obtained to evaluate the vascular anatomy.
CONTRAST:  50mL OMNIPAQUE IOHEXOL 350 MG/ML SOLN

[Series 5: thins · axial · 0.56mm/px · z∈[-838,-551]mm · 14 of 315 slices shown]
[im 14/315  lung]
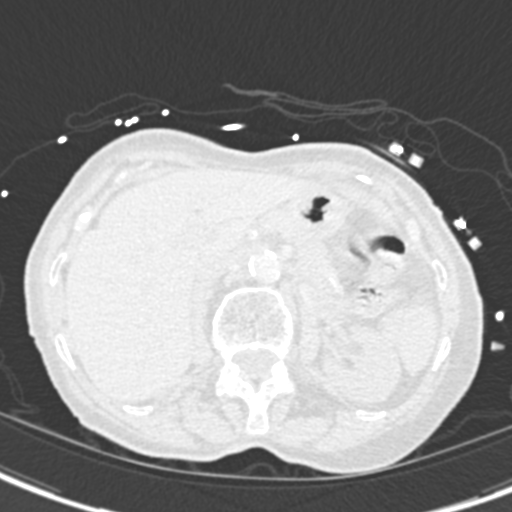
[im 41/315  soft-tissue]
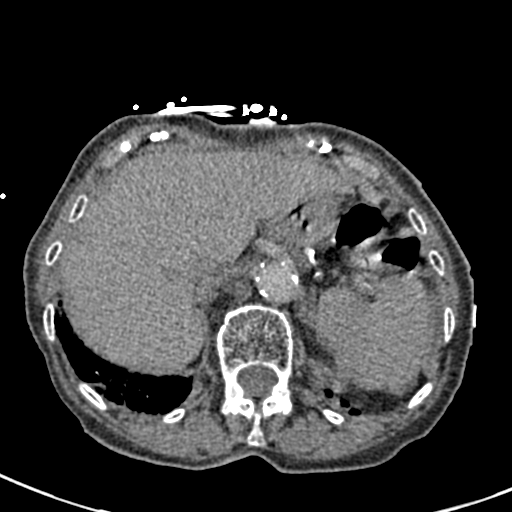
[im 55/315  lung]
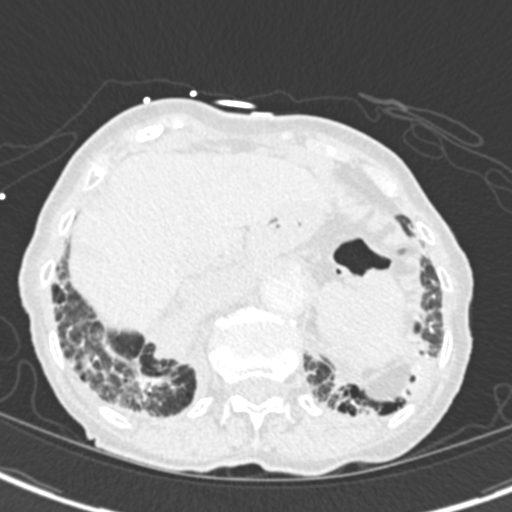
[im 82/315  soft-tissue]
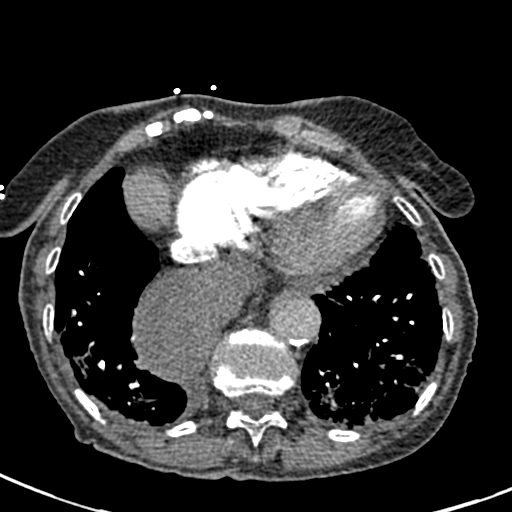
[im 110/315  lung]
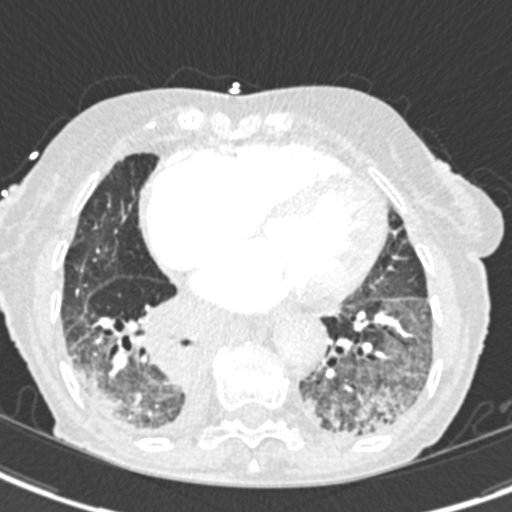
[im 123/315  soft-tissue]
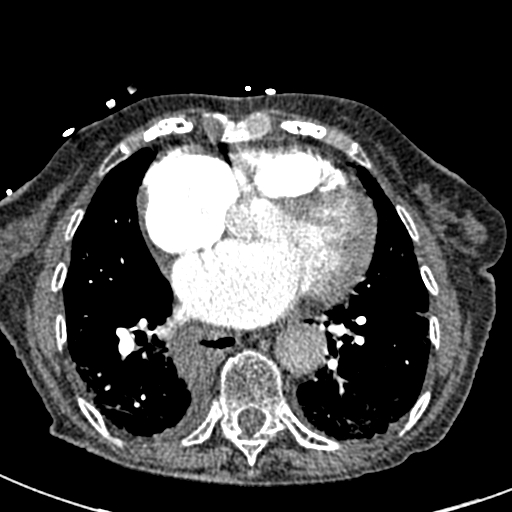
[im 151/315  lung]
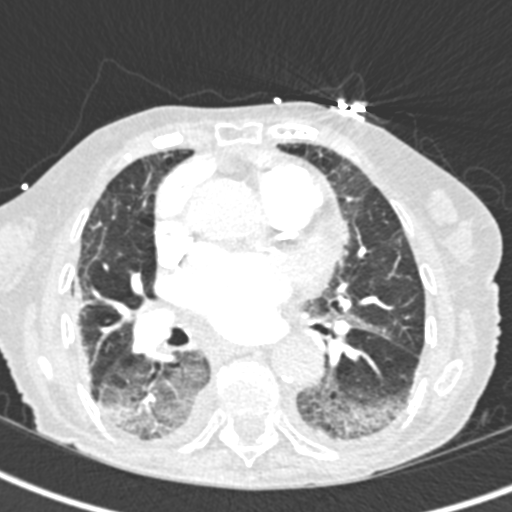
[im 164/315  soft-tissue]
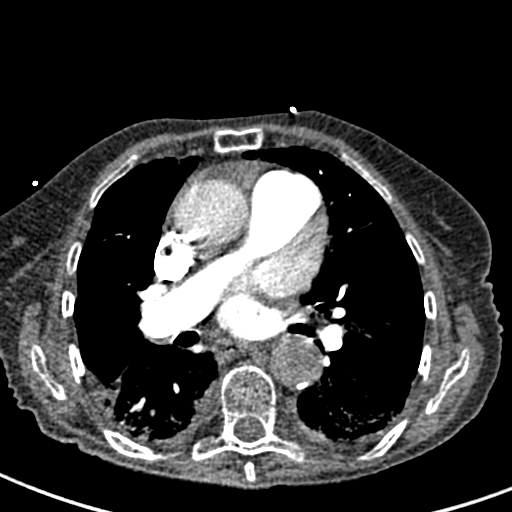
[im 192/315  lung]
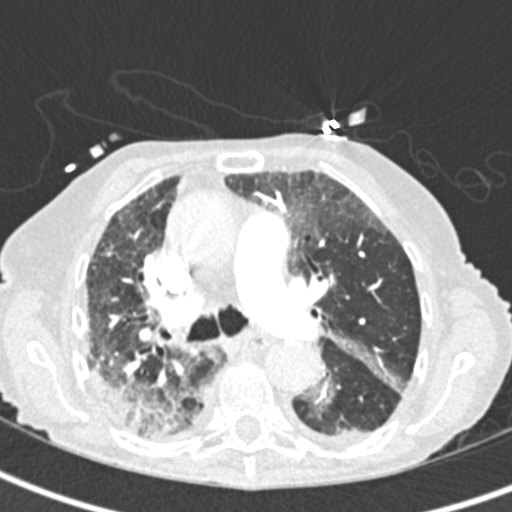
[im 205/315  soft-tissue]
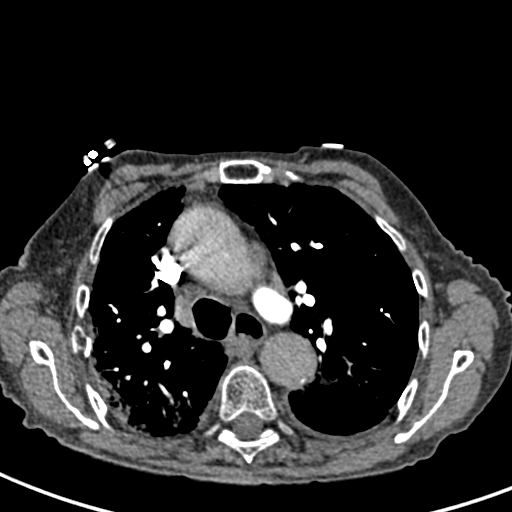
[im 233/315  lung]
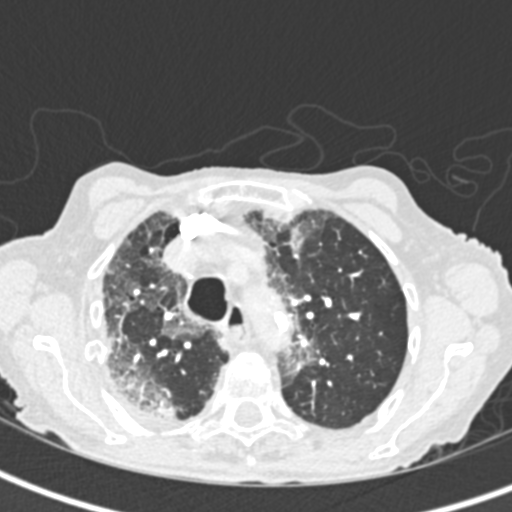
[im 260/315  soft-tissue]
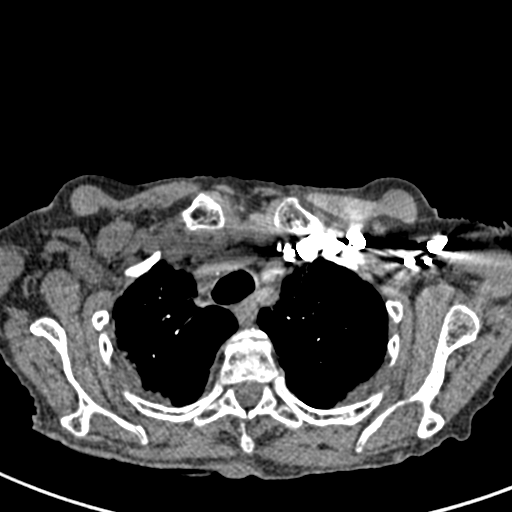
[im 274/315  lung]
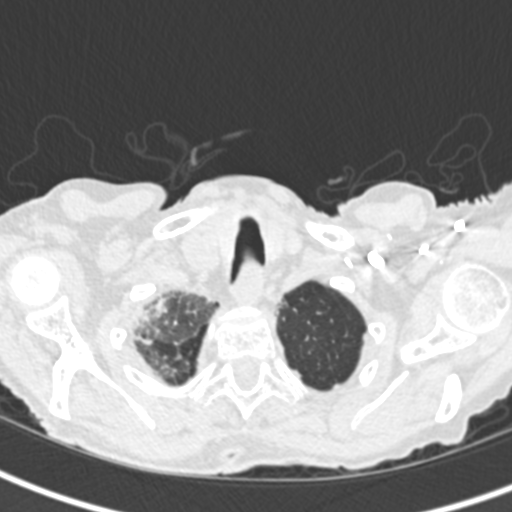
[im 301/315  soft-tissue]
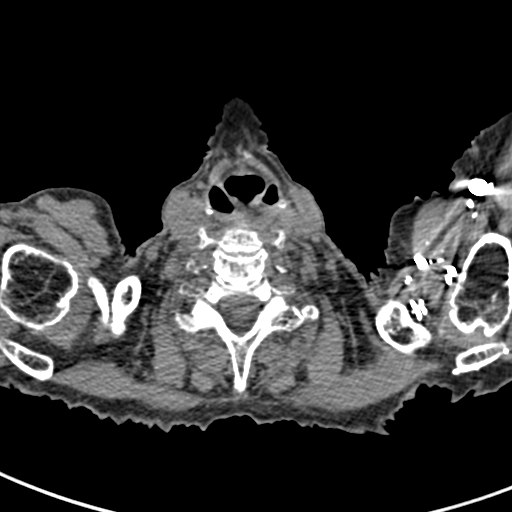

[Series 7: coronal mpr · coronal · 0.53mm/px · 3 of 79 slices shown]
[im 20/79  soft-tissue]
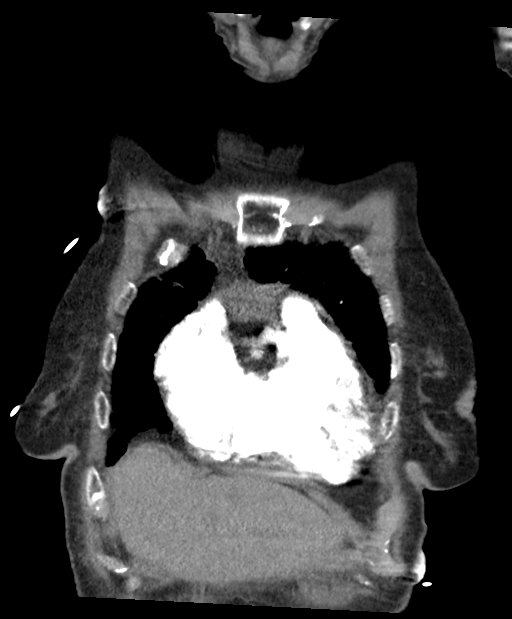
[im 40/79  soft-tissue]
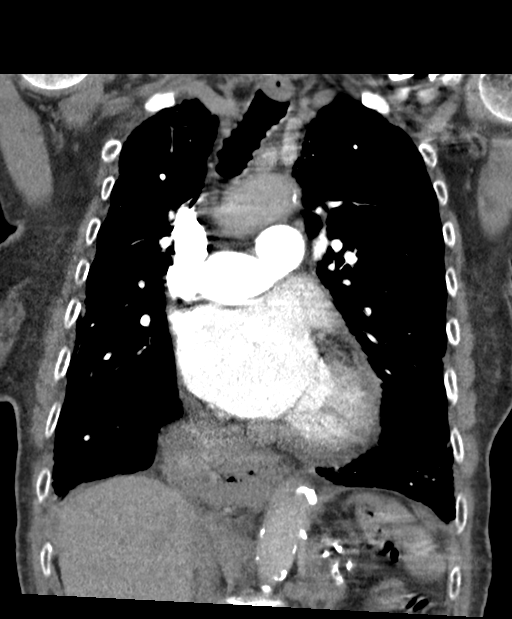
[im 59/79  soft-tissue]
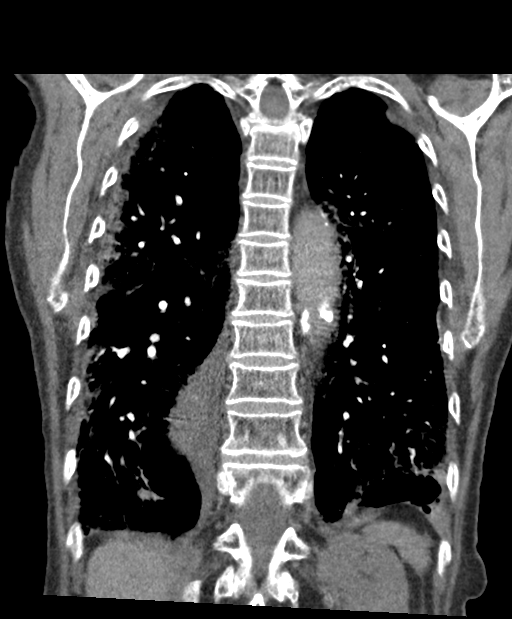

[17 of 46 positions shown; findings below may reference images not displayed]

FINDINGS: Cardiovascular: There is no demonstrable pulmonary embolus. There is
no appreciable thoracic aortic aneurysm. No dissection is seen; note
that the contrast bolus in the aorta is less than optimal for
potential dissection assessment. Visualized great vessels show
scattered foci of atherosclerotic calcification. There is aortic
atherosclerosis. There are multiple foci of coronary artery
calcification. There is no pericardial effusion or pericardial
thickening. Heart is mildly enlarged in a generalized manner. There
is prominence of the main pulmonary outflow tract measuring 3.4 cm.

Mediastinum/Nodes: Thyroid appears unremarkable. There is no
appreciable thoracic adenopathy. There is a large paraesophageal
hernia with much of the stomach above the diaphragm.

Lungs/Pleura: There is underlying centrilobular emphysematous
change. There is bullous disease in the left lower lobe. There is
fibrosis throughout the lower lobe regions with traction
bronchiectatic change in each lower lobe. There is a somewhat mosaic
attenuation throughout the lungs. There is airspace opacity in the
right lower lobe consistent with a degree of pneumonia. Other areas
of somewhat ill-defined opacity are superimposed on apparent
fibrosis.

Upper Abdomen: There is upper abdominal aortic atherosclerosis.
There is calcification in multiple visualized upper abdominal
mesenteric arterial vessels. Visualized upper abdominal structures
otherwise appear unremarkable.

Musculoskeletal: There is anterior wedging of the T1 vertebral body.
There are no blastic or lytic bone lesions. No chest wall lesions
evident.

Review of the MIP images confirms the above findings.
IMPRESSION: 1. No demonstrable pulmonary embolus. No thoracic aortic aneurysm.
No dissection is with note that the contrast bolus within the aorta
is not sufficient for confident dissection assessment. There are
foci of aortic atherosclerosis as well as foci of great vessel and
coronary artery calcification. Mild cardiac enlargement in a
generalized manner.

2. Underlying centrilobular emphysematous change. Fibrosis in the
lung bases with traction bronchiectasis likely represents a degree
of superimposed usual interstitial pneumonitis. There is airspace
opacity consistent with a degree of pneumonia in the right lower
lobe. There are mosaic areas of attenuation throughout the lungs in
part due to redistribution of blood flow to viable lung segments.
There is probable degree also of small airways obstructive disease.
There may be superimposed pneumonia in some of these areas as well.
No consolidation evident. Atypical organism pneumonia is felt to be
likely in this circumstance.

3. Sizable paraesophageal hernia with much of the stomach above the
diaphragm.

4.  No evident adenopathy.

5. Prominence of the main pulmonary outflow tract is felt to be
indicative of a degree of pulmonary arterial hypertension.

Aortic Atherosclerosis (ZAC07-N65.5) and Emphysema (ZAC07-LNN.V).
